# Patient Record
Sex: Male | Born: 1965 | Race: White | Hispanic: No | Marital: Married | Smoking: Never smoker
Health system: Southern US, Community
[De-identification: ages and names within clinical notes are randomized; demographics above are authoritative.]

## PROBLEM LIST (undated history)

## (undated) DIAGNOSIS — K449 Diaphragmatic hernia without obstruction or gangrene: Secondary | ICD-10-CM

## (undated) DIAGNOSIS — Z87442 Personal history of urinary calculi: Secondary | ICD-10-CM

## (undated) DIAGNOSIS — M1990 Unspecified osteoarthritis, unspecified site: Secondary | ICD-10-CM

## (undated) DIAGNOSIS — F32A Depression, unspecified: Secondary | ICD-10-CM

## (undated) DIAGNOSIS — K219 Gastro-esophageal reflux disease without esophagitis: Secondary | ICD-10-CM

## (undated) DIAGNOSIS — N189 Chronic kidney disease, unspecified: Secondary | ICD-10-CM

## (undated) DIAGNOSIS — F329 Major depressive disorder, single episode, unspecified: Secondary | ICD-10-CM

## (undated) DIAGNOSIS — K2270 Barrett's esophagus without dysplasia: Secondary | ICD-10-CM

## (undated) DIAGNOSIS — R110 Nausea: Secondary | ICD-10-CM

## (undated) DIAGNOSIS — F419 Anxiety disorder, unspecified: Secondary | ICD-10-CM

## (undated) HISTORY — PX: VASECTOMY: SHX75

## (undated) HISTORY — PX: FOOT SURGERY: SHX648

## (undated) HISTORY — DX: Diaphragmatic hernia without obstruction or gangrene: K44.9

## (undated) HISTORY — DX: Barrett's esophagus without dysplasia: K22.70

---
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
## 2008-09-13 ENCOUNTER — Ambulatory Visit: Payer: Self-pay | Admitting: General Practice

## 2008-12-24 IMAGING — CR RIGHT TIBIA AND FIBULA - 2 VIEW
1 series · 3 of 3 positions shown · non-contrast
Comparison: none

REASON FOR EXAM: pain redness swelling
COMMENTS:

RESULT:     No fracture, dislocation or other acute bony abnormality is
identified. No lytic or blastic lesions are noted. No radiodense soft tissue
foreign body is seen.

[Series 1: view not recorded · 0.17mm/px · 3 of 3 slices shown]
[im 1/3]
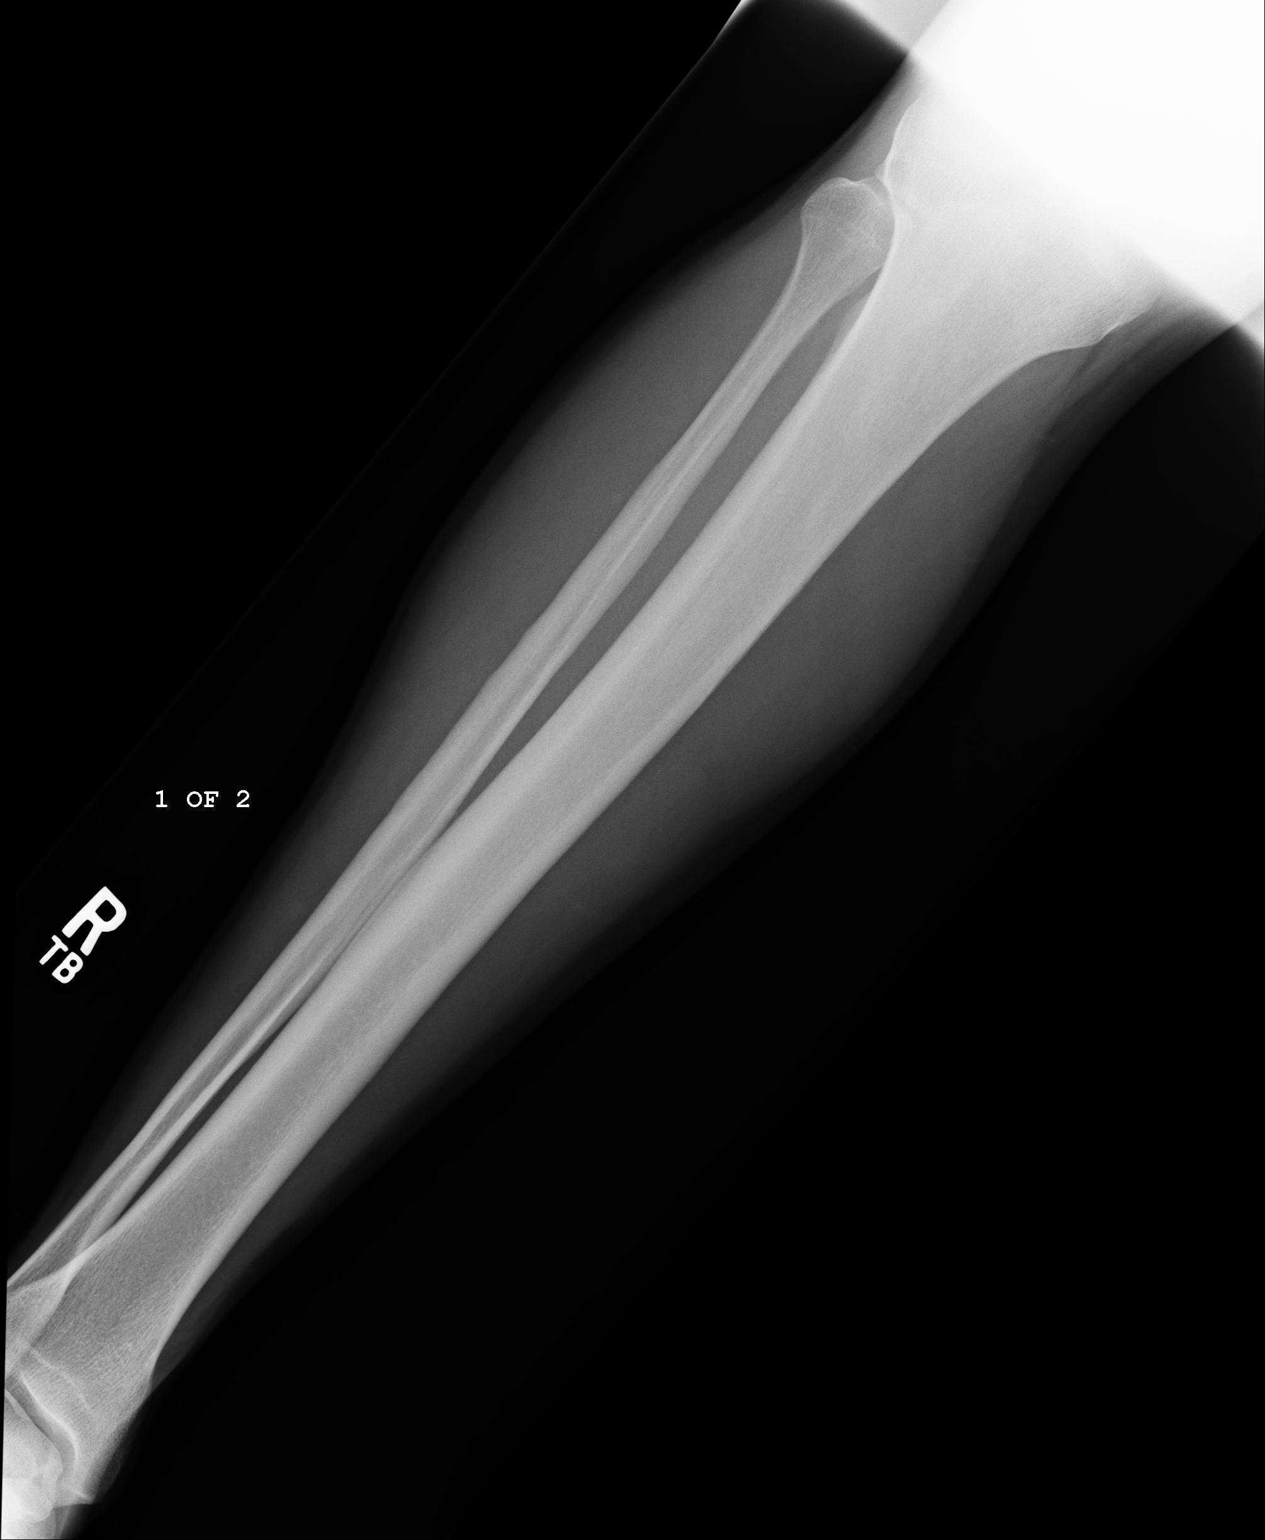
[im 2/3]
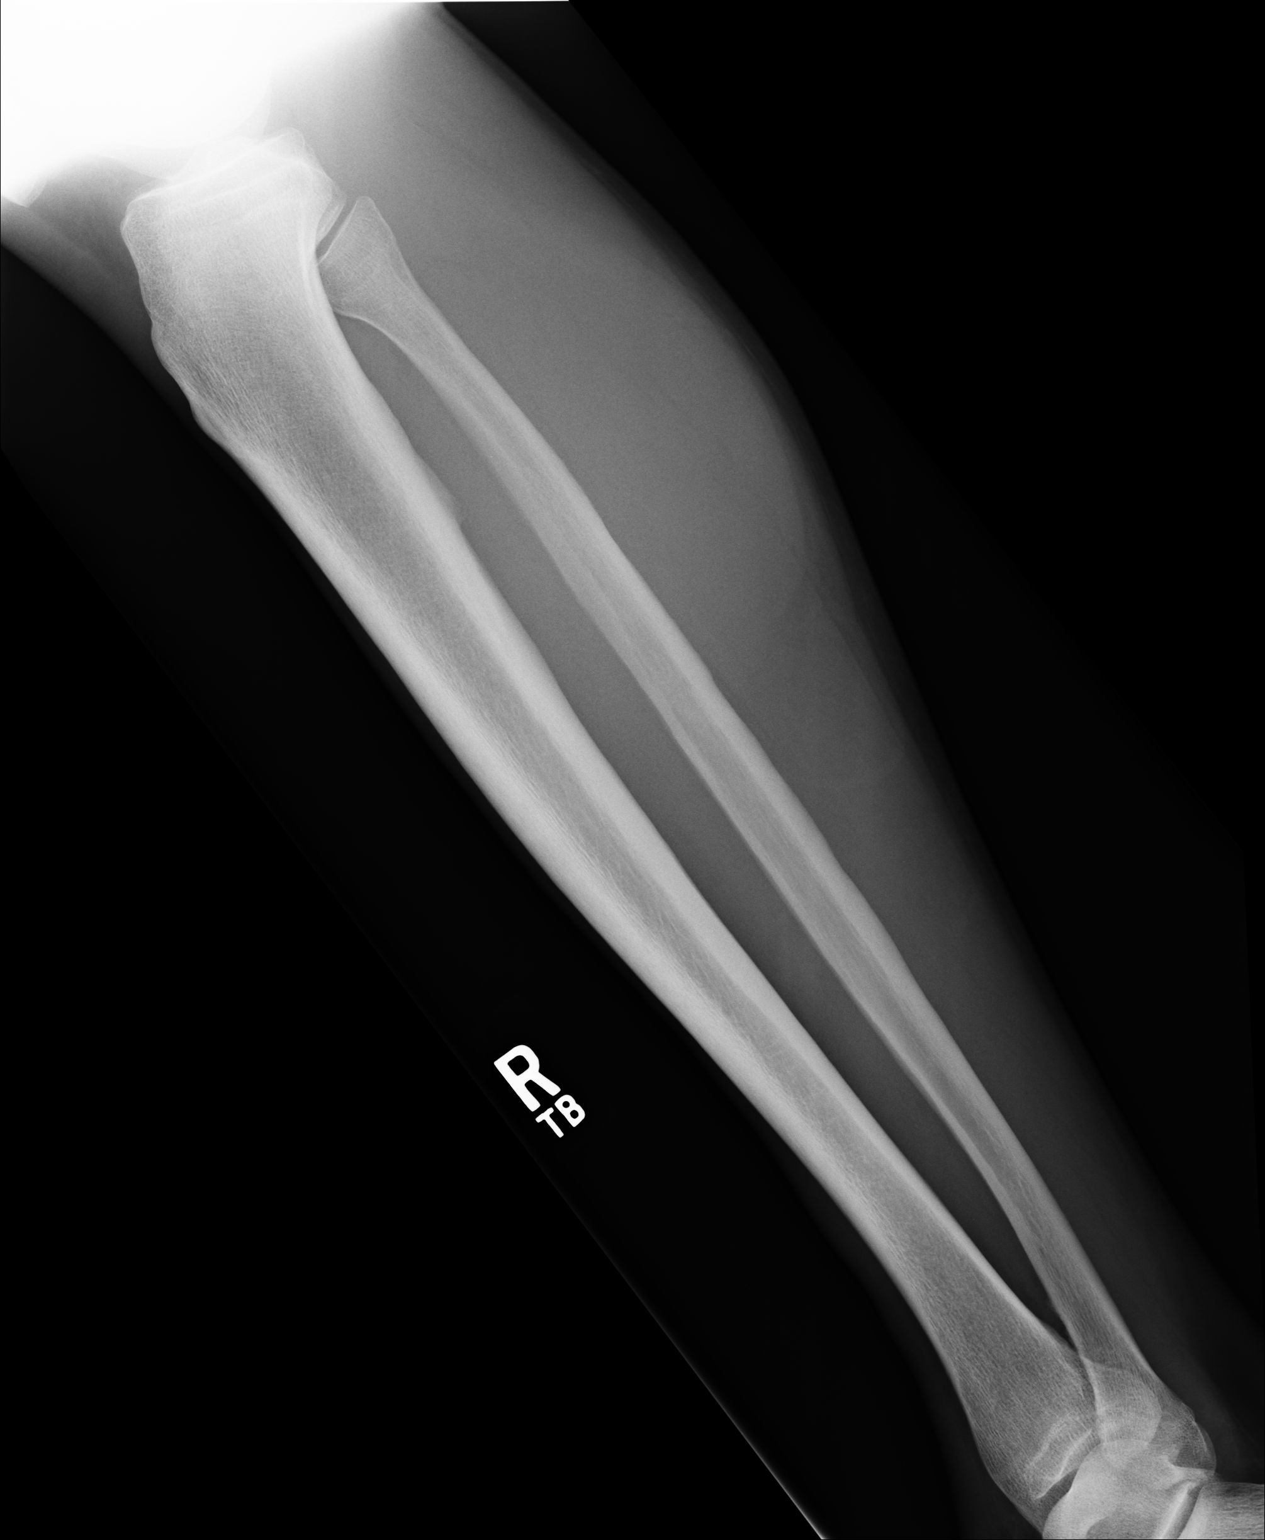
[im 3/3]
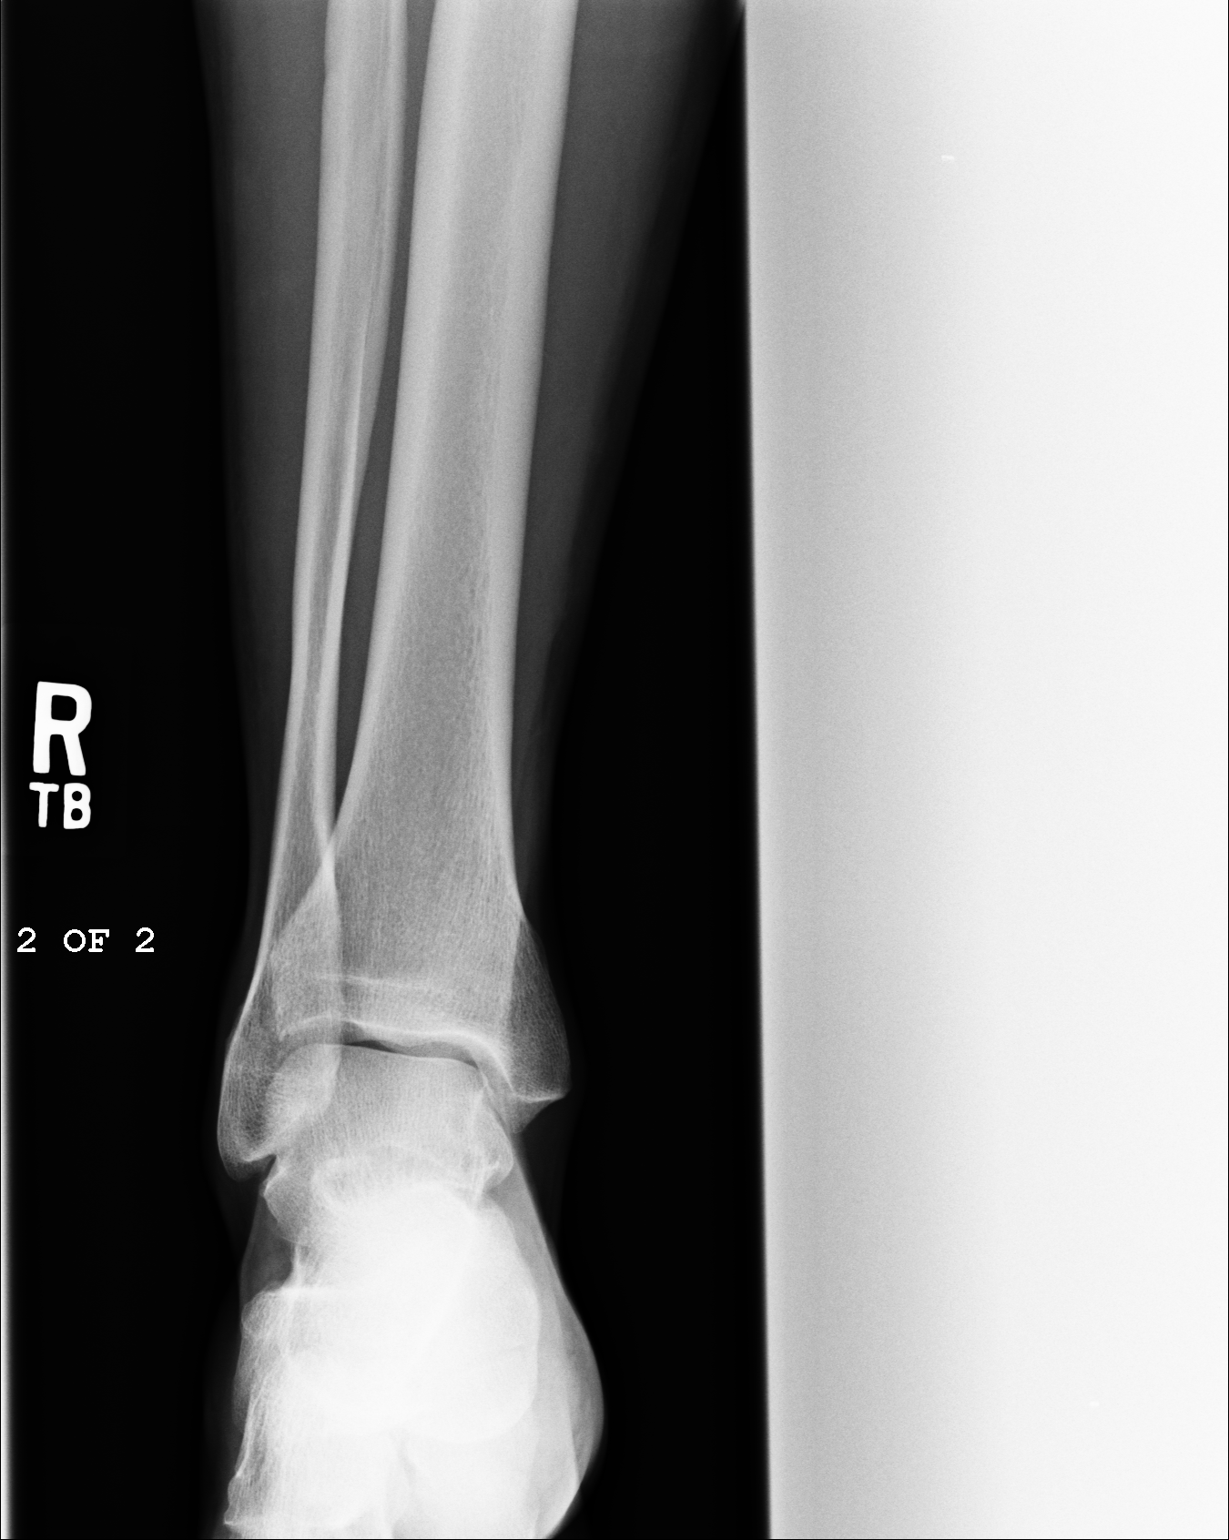

[3 of 3 positions shown; findings below may reference images not displayed]

IMPRESSION: No significant abnormalities are noted.

## 2009-11-17 ENCOUNTER — Emergency Department: Payer: Self-pay | Admitting: Emergency Medicine

## 2009-11-17 IMAGING — CR DG CHEST 1V PORT
1 series · 1 of 1 positions shown · non-contrast
Comparison: none

REASON FOR EXAM: cp
COMMENTS:

PROCEDURE:     DXR - DXR PORTABLE CHEST SINGLE VIEW  - [DATE]  [DATE]
RESULT:     Comparison: None

[view not recorded]
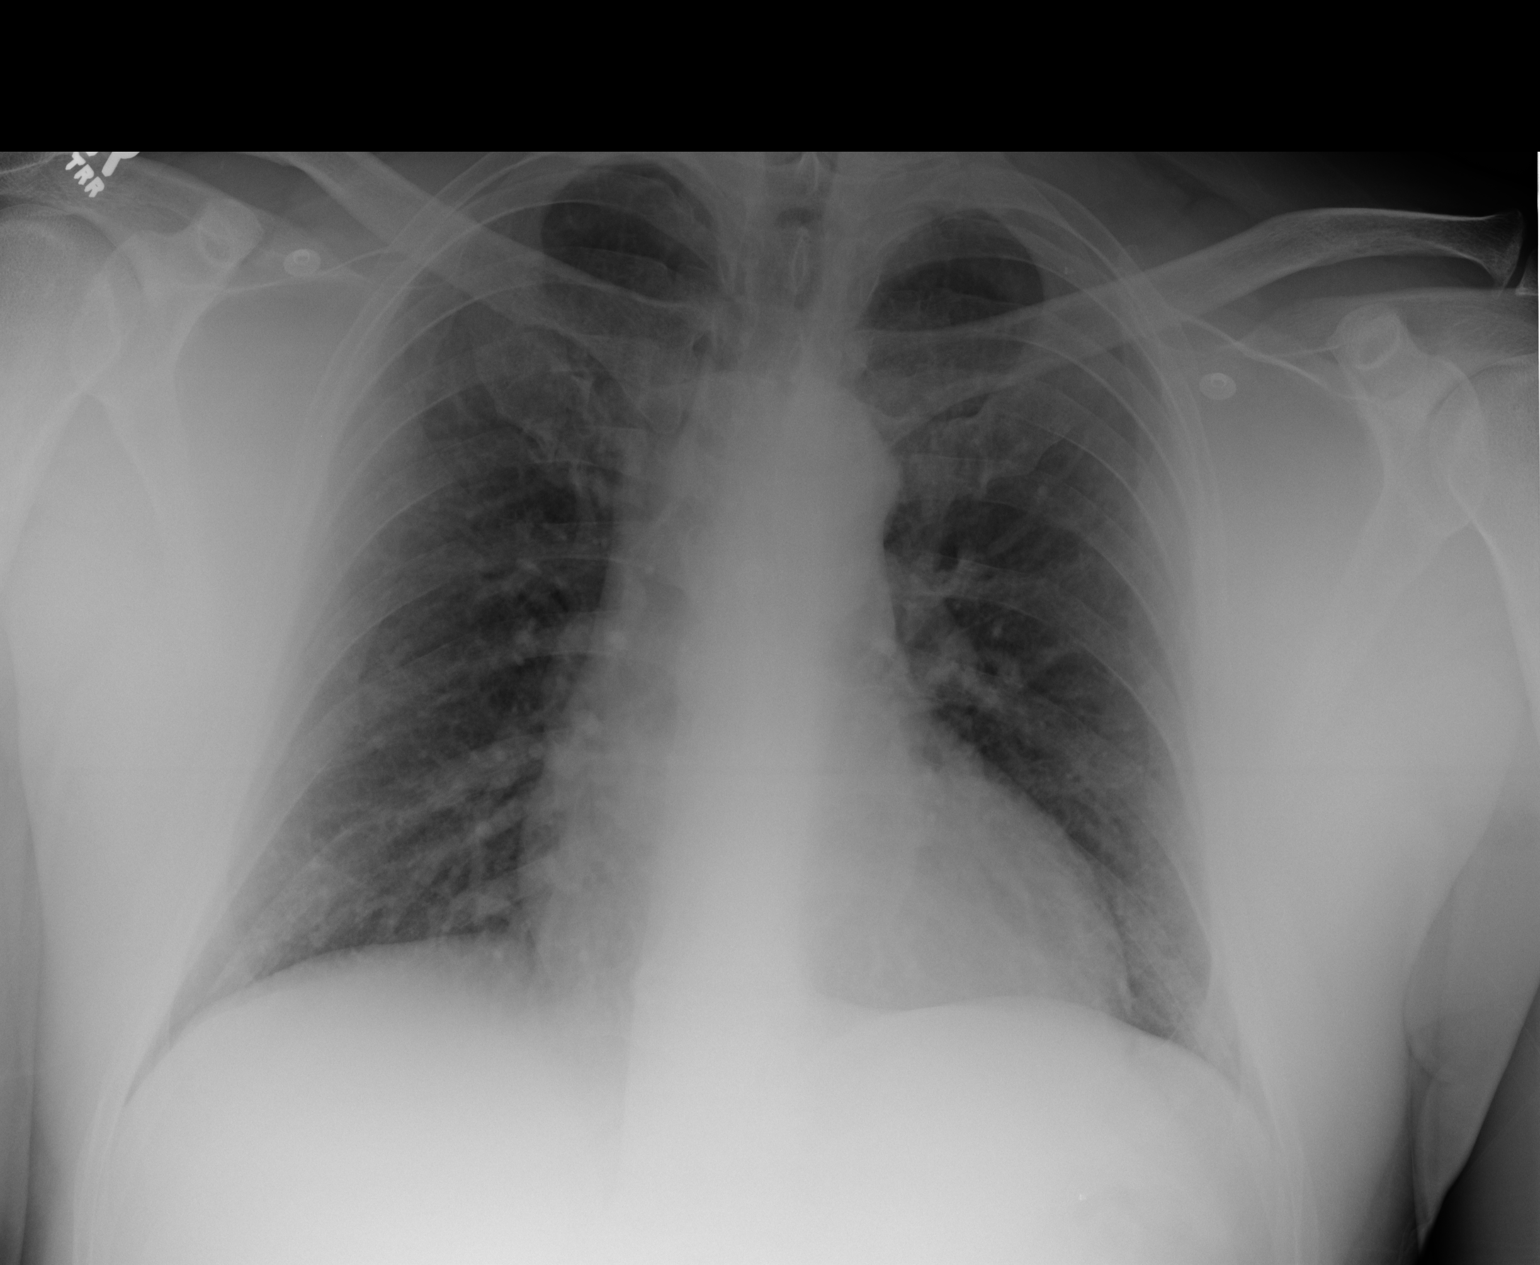

[1 of 1 positions shown; findings below may reference images not displayed]

FINDINGS: Single portable AP chest radiograph is provided. Mild bilateral interstitial
thickening which may reflect interstitial pneumonitis versus mild
interstitial edema versus chronic interstitial disease. There is no focal
parenchymal opacity, pleural effusion, or pneumothorax. Normal
cardiomediastinal silhouette. The osseous structures are unremarkable.
IMPRESSION: Mild bilateral interstitial thickening which may reflect interstitial
pneumonitis versus mild interstitial edema versus chronic interstitial
disease.

## 2012-04-23 HISTORY — PX: FOOT SURGERY: SHX648

## 2012-07-09 ENCOUNTER — Emergency Department: Payer: Self-pay | Admitting: General Practice

## 2012-10-05 ENCOUNTER — Emergency Department: Payer: Self-pay | Admitting: Emergency Medicine

## 2012-10-05 IMAGING — CR DG ANKLE COMPLETE 3+V*L*
1 series · 5 of 5 positions shown · non-contrast
Comparison: none

REASON FOR EXAM: fall
COMMENTS:

PROCEDURE:     DXR - DXR ANKLE LEFT COMPLETE  - [DATE]  [DATE]
RESULT:     Left ankle images show significant soft tissue swelling
laterally in. A definite fractures not appreciated. The internal rotation is
limited. An occult fracture or tiny avulsion distally is not excluded.

[Series 1: x ankle ap left · 0.14mm/px · 5 of 5 slices shown]
[im 1/5]
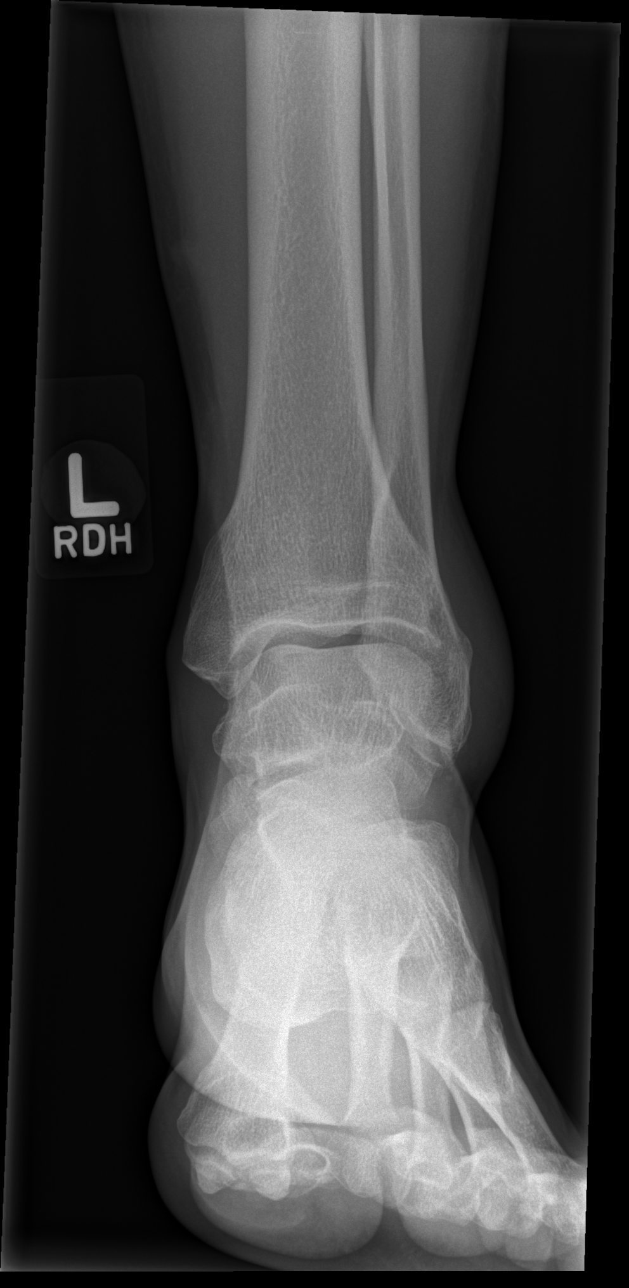
[im 2/5]
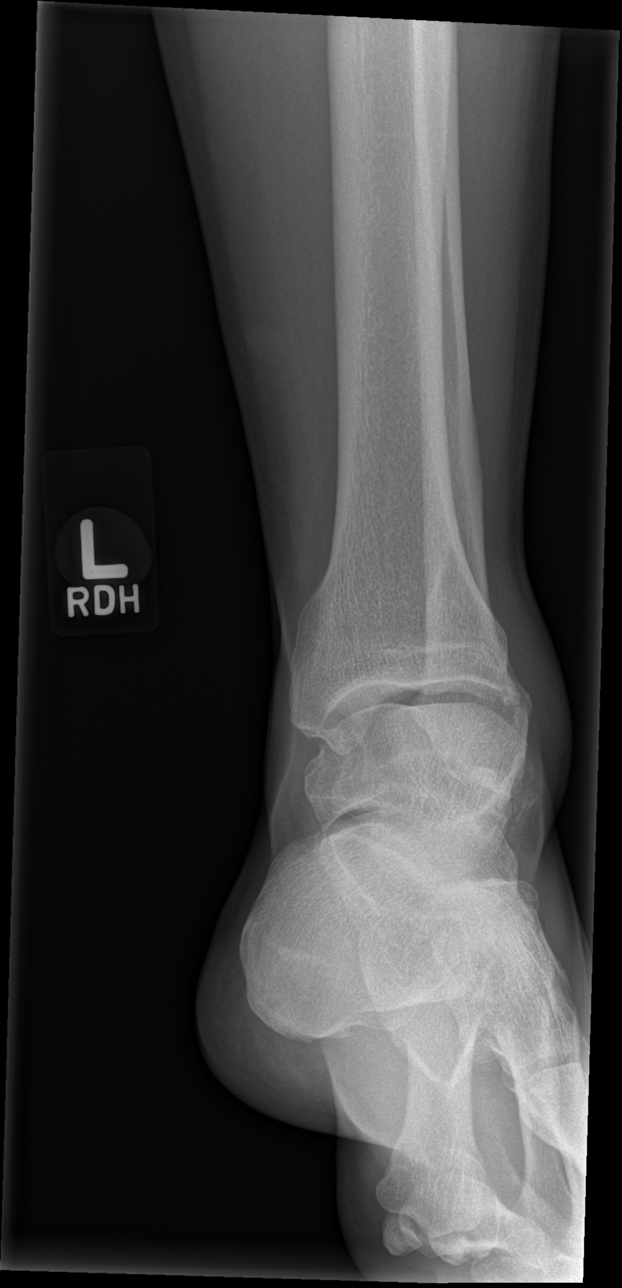
[im 3/5]
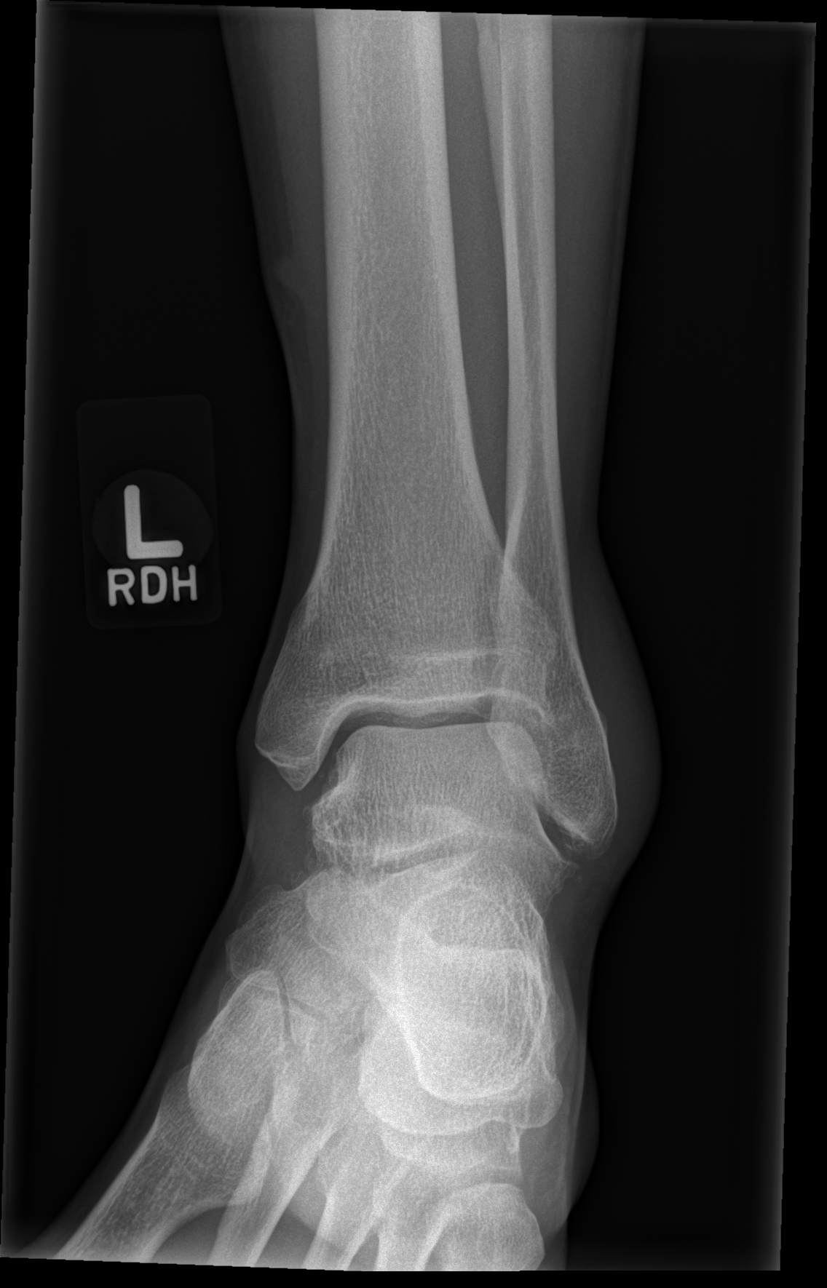
[im 4/5]
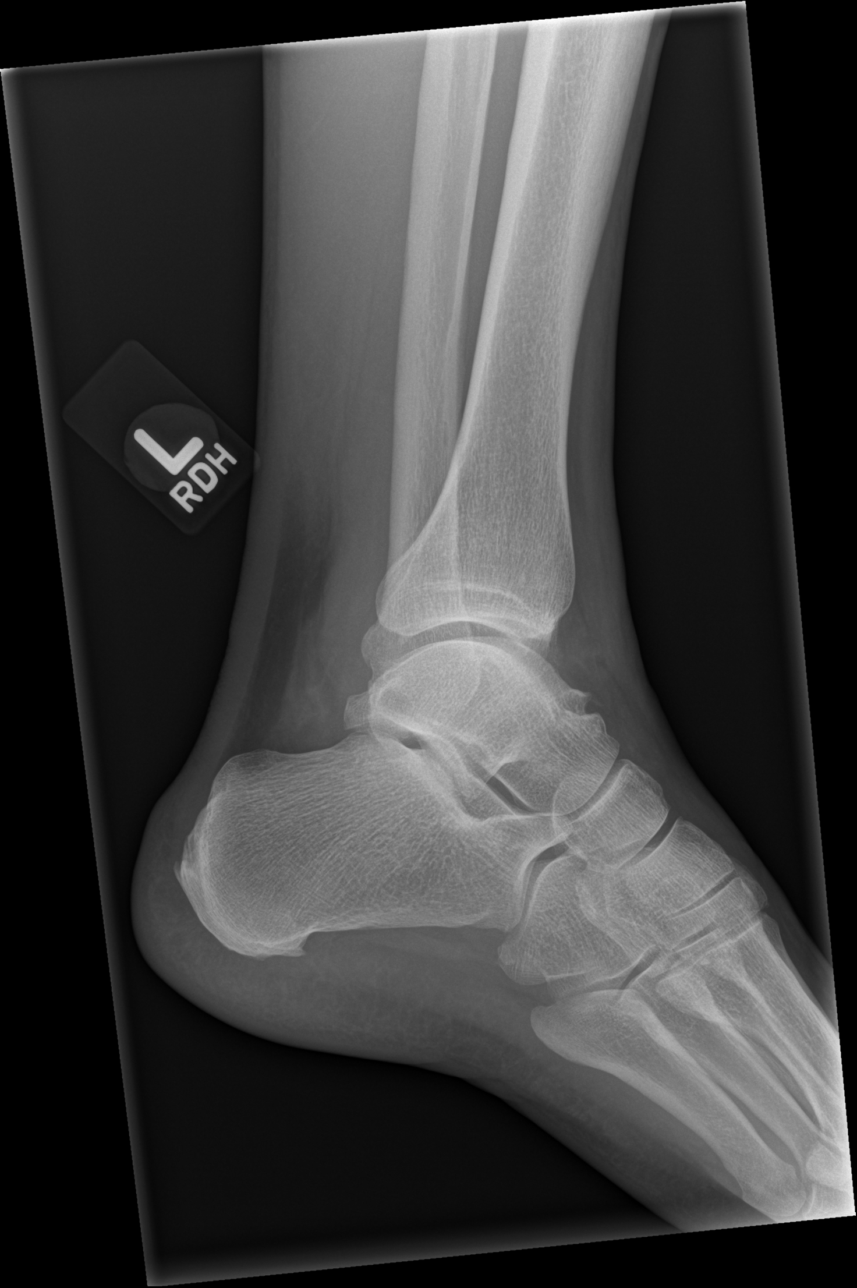
[im 5/5]
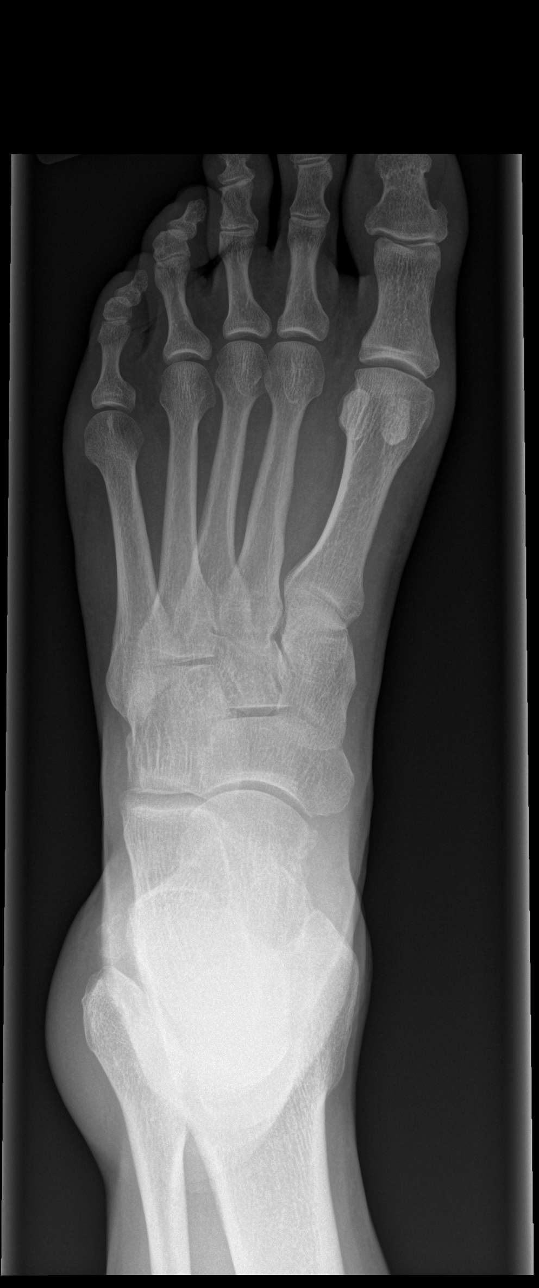

[5 of 5 positions shown; findings below may reference images not displayed]

IMPRESSION: Significant soft tissue swelling. Possible tiny avulsion in
the distal fibula region.

[REDACTED]

## 2013-03-09 ENCOUNTER — Encounter: Payer: Self-pay | Admitting: Podiatry

## 2013-03-13 ENCOUNTER — Encounter: Payer: Self-pay | Admitting: Podiatry

## 2013-03-13 ENCOUNTER — Ambulatory Visit (INDEPENDENT_AMBULATORY_CARE_PROVIDER_SITE_OTHER): Payer: No Typology Code available for payment source | Admitting: Podiatry

## 2013-03-13 ENCOUNTER — Ambulatory Visit (INDEPENDENT_AMBULATORY_CARE_PROVIDER_SITE_OTHER): Payer: No Typology Code available for payment source

## 2013-03-13 VITALS — BP 139/90 | HR 60 | Resp 16 | Ht 73.0 in | Wt 215.0 lb

## 2013-03-13 DIAGNOSIS — M79671 Pain in right foot: Secondary | ICD-10-CM

## 2013-03-13 DIAGNOSIS — M7660 Achilles tendinitis, unspecified leg: Secondary | ICD-10-CM

## 2013-03-13 DIAGNOSIS — M79609 Pain in unspecified limb: Secondary | ICD-10-CM

## 2013-03-13 MED ORDER — TRIAMCINOLONE ACETONIDE 10 MG/ML IJ SUSP
5.0000 mg | Freq: Once | INTRAMUSCULAR | Status: AC
  Administered 2013-03-13: 5 mg via INTRA_ARTICULAR

## 2013-03-13 NOTE — Patient Instructions (Signed)
Plantar Fasciitis (Heel Spur Syndrome) with Rehab The plantar fascia is a fibrous, ligament-like, soft-tissue structure that spans the bottom of the foot. Plantar fasciitis is a condition that causes pain in the foot due to inflammation of the tissue. SYMPTOMS   Pain and tenderness on the underneath side of the foot.  Pain that worsens with standing or walking. CAUSES  Plantar fasciitis is caused by irritation and injury to the plantar fascia on the underneath side of the foot. Common mechanisms of injury include:  Direct trauma to bottom of the foot.  Damage to a small nerve that runs under the foot where the main fascia attaches to the heel bone.  Stress placed on the plantar fascia due to bone spurs. RISK INCREASES WITH:   Activities that place stress on the plantar fascia (running, jumping, pivoting, or cutting).  Poor strength and flexibility.  Improperly fitted shoes.  Tight calf muscles.  Flat feet.  Failure to warm-up properly before activity.  Obesity. PREVENTION  Warm up and stretch properly before activity.  Allow for adequate recovery between workouts.  Maintain physical fitness:  Strength, flexibility, and endurance.  Cardiovascular fitness.  Maintain a health body weight.  Avoid stress on the plantar fascia.  Wear properly fitted shoes, including arch supports for individuals who have flat feet. PROGNOSIS  If treated properly, then the symptoms of plantar fasciitis usually resolve without surgery. However, occasionally surgery is necessary. RELATED COMPLICATIONS   Recurrent symptoms that may result in a chronic condition.  Problems of the lower back that are caused by compensating for the injury, such as limping.  Pain or weakness of the foot during push-off following surgery.  Chronic inflammation, scarring, and partial or complete fascia tear, occurring more often from repeated injections. TREATMENT  Treatment initially involves the use of  ice and medication to help reduce pain and inflammation. The use of strengthening and stretching exercises may help reduce pain with activity, especially stretches of the Achilles tendon. These exercises may be performed at home or with a therapist. Your caregiver may recommend that you use heel cups of arch supports to help reduce stress on the plantar fascia. Occasionally, corticosteroid injections are given to reduce inflammation. If symptoms persist for greater than 6 months despite non-surgical (conservative), then surgery may be recommended.  MEDICATION   If pain medication is necessary, then nonsteroidal anti-inflammatory medications, such as aspirin and ibuprofen, or other minor pain relievers, such as acetaminophen, are often recommended.  Do not take pain medication within 7 days before surgery.  Prescription pain relievers may be given if deemed necessary by your caregiver. Use only as directed and only as much as you need.  Corticosteroid injections may be given by your caregiver. These injections should be reserved for the most serious cases, because they may only be given a certain number of times. HEAT AND COLD  Cold treatment (icing) relieves pain and reduces inflammation. Cold treatment should be applied for 10 to 15 minutes every 2 to 3 hours for inflammation and pain and immediately after any activity that aggravates your symptoms. Use ice packs or massage the area with a piece of ice (ice massage).  Heat treatment may be used prior to performing the stretching and strengthening activities prescribed by your caregiver, physical therapist, or athletic trainer. Use a heat pack or soak the injury in warm water. SEEK IMMEDIATE MEDICAL CARE IF:  Treatment seems to offer no benefit, or the condition worsens.  Any medications produce adverse side effects. EXERCISES RANGE   OF MOTION (ROM) AND STRETCHING EXERCISES - Plantar Fasciitis (Heel Spur Syndrome) These exercises may help you  when beginning to rehabilitate your injury. Your symptoms may resolve with or without further involvement from your physician, physical therapist or athletic trainer. While completing these exercises, remember:   Restoring tissue flexibility helps normal motion to return to the joints. This allows healthier, less painful movement and activity.  An effective stretch should be held for at least 30 seconds.  A stretch should never be painful. You should only feel a gentle lengthening or release in the stretched tissue. RANGE OF MOTION - Toe Extension, Flexion  Sit with your right / left leg crossed over your opposite knee.  Grasp your toes and gently pull them back toward the top of your foot. You should feel a stretch on the bottom of your toes and/or foot.  Hold this stretch for __________ seconds.  Now, gently pull your toes toward the bottom of your foot. You should feel a stretch on the top of your toes and or foot.  Hold this stretch for __________ seconds. Repeat __________ times. Complete this stretch __________ times per day.  RANGE OF MOTION - Ankle Dorsiflexion, Active Assisted  Remove shoes and sit on a chair that is preferably not on a carpeted surface.  Place right / left foot under knee. Extend your opposite leg for support.  Keeping your heel down, slide your right / left foot back toward the chair until you feel a stretch at your ankle or calf. If you do not feel a stretch, slide your bottom forward to the edge of the chair, while still keeping your heel down.  Hold this stretch for __________ seconds. Repeat __________ times. Complete this stretch __________ times per day.  STRETCH  Gastroc, Standing  Place hands on wall.  Extend right / left leg, keeping the front knee somewhat bent.  Slightly point your toes inward on your back foot.  Keeping your right / left heel on the floor and your knee straight, shift your weight toward the wall, not allowing your back to  arch.  You should feel a gentle stretch in the right / left calf. Hold this position for __________ seconds. Repeat __________ times. Complete this stretch __________ times per day. STRETCH  Soleus, Standing  Place hands on wall.  Extend right / left leg, keeping the other knee somewhat bent.  Slightly point your toes inward on your back foot.  Keep your right / left heel on the floor, bend your back knee, and slightly shift your weight over the back leg so that you feel a gentle stretch deep in your back calf.  Hold this position for __________ seconds. Repeat __________ times. Complete this stretch __________ times per day. STRETCH  Gastrocsoleus, Standing  Note: This exercise can place a lot of stress on your foot and ankle. Please complete this exercise only if specifically instructed by your caregiver.   Place the ball of your right / left foot on a step, keeping your other foot firmly on the same step.  Hold on to the wall or a rail for balance.  Slowly lift your other foot, allowing your body weight to press your heel down over the edge of the step.  You should feel a stretch in your right / left calf.  Hold this position for __________ seconds.  Repeat this exercise with a slight bend in your right / left knee. Repeat __________ times. Complete this stretch __________ times per day.    STRENGTHENING EXERCISES - Plantar Fasciitis (Heel Spur Syndrome)  These exercises may help you when beginning to rehabilitate your injury. They may resolve your symptoms with or without further involvement from your physician, physical therapist or athletic trainer. While completing these exercises, remember:   Muscles can gain both the endurance and the strength needed for everyday activities through controlled exercises.  Complete these exercises as instructed by your physician, physical therapist or athletic trainer. Progress the resistance and repetitions only as guided. STRENGTH - Towel  Curls  Sit in a chair positioned on a non-carpeted surface.  Place your foot on a towel, keeping your heel on the floor.  Pull the towel toward your heel by only curling your toes. Keep your heel on the floor.  If instructed by your physician, physical therapist or athletic trainer, add ____________________ at the end of the towel. Repeat __________ times. Complete this exercise __________ times per day. STRENGTH - Ankle Inversion  Secure one end of a rubber exercise band/tubing to a fixed object (table, pole). Loop the other end around your foot just before your toes.  Place your fists between your knees. This will focus your strengthening at your ankle.  Slowly, pull your big toe up and in, making sure the band/tubing is positioned to resist the entire motion.  Hold this position for __________ seconds.  Have your muscles resist the band/tubing as it slowly pulls your foot back to the starting position. Repeat __________ times. Complete this exercises __________ times per day.  Document Released: 04/09/2005 Document Revised: 07/02/2011 Document Reviewed: 07/22/2008 [employer] Patient Information 2014 [employer]. Plantar Fasciitis Plantar fasciitis is a common condition that causes foot pain. It is soreness (inflammation) of the band of tough fibrous tissue on the bottom of the foot that runs from the heel bone (calcaneus) to the ball of the foot. The cause of this soreness may be from excessive standing, poor fitting shoes, running on hard surfaces, being overweight, having an abnormal walk, or overuse (this is common in runners) of the painful foot or feet. It is also common in aerobic exercise dancers and ballet dancers. SYMPTOMS  Most people with plantar fasciitis complain of:  Severe pain in the morning on the bottom of their foot especially when taking the first steps out of bed. This pain recedes after a few minutes of walking.  Severe pain is experienced also during walking  following a long period of inactivity.  Pain is worse when walking barefoot or up stairs DIAGNOSIS   Your caregiver will diagnose this condition by examining and feeling your foot.  Special tests such as X-rays of your foot, are usually not needed. PREVENTION   Consult a sports medicine professional before beginning a new exercise program.  Walking programs offer a good workout. With walking there is a lower chance of overuse injuries common to runners. There is less impact and less jarring of the joints.  Begin all new exercise programs slowly. If problems or pain develop, decrease the amount of time or distance until you are at a comfortable level.  Wear good shoes and replace them regularly.  Stretch your foot and the heel cords at the back of the ankle (Achilles tendon) both before and after exercise.  Run or exercise on even surfaces that are not hard. For example, asphalt is better than pavement.  Do not run barefoot on hard surfaces.  If using a treadmill, vary the incline.  Do not continue to workout if you have foot or joint   problems. Seek professional help if they do not improve. HOME CARE INSTRUCTIONS   Avoid activities that cause you pain until you recover.  Use ice or cold packs on the problem or painful areas after working out.  Only take over-the-counter or prescription medicines for pain, discomfort, or fever as directed by your caregiver.  Soft shoe inserts or athletic shoes with air or gel sole cushions may be helpful.  If problems continue or become more severe, consult a sports medicine caregiver or your own health care provider. Cortisone is a potent anti-inflammatory medication that may be injected into the painful area. You can discuss this treatment with your caregiver. MAKE SURE YOU:   Understand these instructions.  Will watch your condition.  Will get help right away if you are not doing well or get worse. Document Released: 01/02/2001 Document  Revised: 07/02/2011 Document Reviewed: 03/03/2008 [employer] Patient Information 2014 [employer].  

## 2013-03-13 NOTE — Progress Notes (Signed)
Subjective:     Patient ID: Kyle Gallegos, male   DOB: 1965/11/21, 47 y.o.   MRN: [id number]  Foot Pain   patient's found to have a painful right heel lateral side that states has been present for about 6-8 months with relief of about one year from previous treatment. Plantar heel that we did surgery on is doing very well and patient has not had full evaluating in approximately 4 years   Review of Systems  All other systems reviewed and are negative.       Objective:   Physical Exam  Nursing note and vitals reviewed. Constitutional: He is oriented to person, place, and time.  Cardiovascular: Intact distal pulses.   Musculoskeletal: Normal range of motion.  Neurological: He is oriented to person, place, and time.  Skin: Skin is warm.   patient is found to have posterior heel pain right lateral and central band of the Achilles tendon with no equinus condition noted or muscle strength loss and normal neurovascular sensation and circulation. No other pathology noted currently    Assessment:     Tendinitis right Achilles lateral side and slightly into the central tendon    Plan:     H&P and x-rays reviewed with patient care for lateral injection done after explaining to him the chances for rupture associated with the injection. I also discussed possibilities for E. Pat in the future if symptoms continue to persist injection of 3 mg dexamethasone Kenalog combination 5 mg Xylocaine and night splint dispensed for home usage reappoint her recheck when symptomatic

## 2013-09-22 DIAGNOSIS — E559 Vitamin D deficiency, unspecified: Secondary | ICD-10-CM | POA: Insufficient documentation

## 2014-04-14 ENCOUNTER — Ambulatory Visit: Payer: Self-pay | Admitting: Family Medicine

## 2014-04-23 HISTORY — PX: FOOT SURGERY: SHX648

## 2015-03-30 ENCOUNTER — Encounter: Payer: Self-pay | Admitting: Podiatry

## 2015-03-30 ENCOUNTER — Ambulatory Visit (INDEPENDENT_AMBULATORY_CARE_PROVIDER_SITE_OTHER): Payer: No Typology Code available for payment source | Admitting: Podiatry

## 2015-03-30 ENCOUNTER — Ambulatory Visit (INDEPENDENT_AMBULATORY_CARE_PROVIDER_SITE_OTHER): Payer: No Typology Code available for payment source

## 2015-03-30 VITALS — BP 151/84 | HR 52 | Resp 16

## 2015-03-30 DIAGNOSIS — M79671 Pain in right foot: Secondary | ICD-10-CM | POA: Diagnosis not present

## 2015-03-30 NOTE — Progress Notes (Signed)
Subjective:     Patient ID: Kyle LollingJames O Zollars, male   DOB: 01-11-1966, 49 y.o.   MRN: [id number]  HPI patient states I have severe shooting pains between the third and fourth toes of my right foot which is been going on for a while. 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 Patient states she's tried wider shoes and he's tried soaks without relief   Review of Systems  All other systems reviewed and are negative.      Objective:   Physical Exam  Constitutional: He is oriented to person, place, and time.  Cardiovascular: Intact distal pulses.   Musculoskeletal: Normal range of motion.  Neurological: He is oriented to person, place, and time.  Skin: Skin is warm.  Nursing note and vitals reviewed.  neurovascular status found to be intact muscle strength adequate range of motion within normal limits with patient having significant pinching and shooting pains third interspace right foot with palpable mass noted upon pressure. 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 I've recommended neurectomy and I explained surgery to him and reviewed alternative treatments and complications. Patient wants to have this fixed understanding all complications and the fact there is no long-term guarantees and that recovery can take from 6 months to one year for full recovery. At this time patient was given all preoperative instructions was encouraged to call with questions and is scheduled for surgery in the next several weeks

## 2015-03-30 NOTE — Patient Instructions (Signed)
Pre-Operative Instructions  Congratulations, you have decided to take an important step to improving your quality of life.  You can be assured that the doctors of [facility] will be with you every step of the way.  1. Plan to be at the surgery center/hospital at least 1 (one) hour prior to your scheduled time unless otherwise directed by the surgical center/hospital staff.  You must have a responsible adult accompany you, remain during the surgery and drive you home.  Make sure you have directions to the surgical center/hospital and know how to get there on time. 2. For hospital based surgery you will need to obtain a history and physical form from your family physician within 1 month prior to the date of surgery- we will give you a form for you primary physician.  3. We make every effort to accommodate the date you request for surgery.  There are however, times where surgery dates or times have to be moved.  We will contact you as soon as possible if a change in schedule is required.   4. No Aspirin/Ibuprofen for one week before surgery.  If you are on aspirin, any non-steroidal anti-inflammatory medications (Mobic, Aleve, Ibuprofen) you should stop taking it 7 days prior to your surgery.  You make take Tylenol  For pain prior to surgery.  5. Medications- If you are taking daily heart and blood pressure medications, seizure, reflux, allergy, asthma, anxiety, pain or diabetes medications, make sure the surgery center/hospital is aware before the day of surgery so they may notify you which medications to take or avoid the day of surgery. 6. No food or drink after midnight the night before surgery unless directed otherwise by surgical center/hospital staff. 7. No alcoholic beverages 24 hours prior to surgery.  No smoking 24 hours prior to or 24 hours after surgery. 8. Wear loose pants or shorts- loose enough to fit over bandages, boots, and casts. 9. No slip on shoes, sneakers are best. 10. Bring  your boot with you to the surgery center/hospital.  Also bring crutches or a walker if your physician has prescribed it for you.  If you do not have this equipment, it will be provided for you after surgery. 11. If you have not been contracted by the surgery center/hospital by the day before your surgery, call to confirm the date and time of your surgery. 12. Leave-time from work may vary depending on the type of surgery you have.  Appropriate arrangements should be made prior to surgery with your employer. 13. Prescriptions will be provided immediately following surgery by your doctor.  Have these filled as soon as possible after surgery and take the medication as directed. 14. Remove nail polish on the operative foot. 15. Wash the night before surgery.  The night before surgery wash the foot and leg well with the antibacterial soap provided and water paying special attention to beneath the toenails and in between the toes.  Rinse thoroughly with water and dry well with a towel.  Perform this wash unless told not to do so by your physician.  Enclosed: 1 Ice pack (please put in freezer the night before surgery)   1 Hibiclens skin cleaner   Pre-op Instructions  If you have any questions regarding the instructions, do not hesitate to call our office.  [place]: [address] [place], [place] [address] [phone]  [place]: [address] [place], [place] [address] [phone]  [place]: [address]  [place], [place] [address] [phone]  Dr. Richard   Tuchman DPM, Dr. Norman Regal DPM Dr. Richard Sikora DPM, Dr. M. Todd Hyatt DPM, Dr. Kathryn Egerton DPM 

## 2015-04-07 DIAGNOSIS — G5760 Lesion of plantar nerve, unspecified lower limb: Secondary | ICD-10-CM | POA: Diagnosis not present

## 2015-04-12 ENCOUNTER — Ambulatory Visit (INDEPENDENT_AMBULATORY_CARE_PROVIDER_SITE_OTHER): Payer: No Typology Code available for payment source | Admitting: Podiatry

## 2015-04-12 VITALS — Temp 97.5°F

## 2015-04-12 DIAGNOSIS — Z9889 Other specified postprocedural states: Secondary | ICD-10-CM

## 2015-04-12 DIAGNOSIS — M79671 Pain in right foot: Secondary | ICD-10-CM

## 2015-04-12 NOTE — Progress Notes (Signed)
Subjective:     Patient ID: Thomes LollingJames O Vanscyoc, male   DOB: [birth year], 49 y.o.   MRN: [id number]  HPI this patient presents to the office follow-up for a neurectomy of the third interspace of the right foot. He presents to the office stating he is doing well and not much discomfort is noted. He presents to the office wearing the bandage  was applied at the surgical center as well as walking in the surgical shoe.     Review of Systems     Objective:   Physical Exam GENERAL APPEARANCE: Alert, conversant. Appropriately groomed. No acute distress.  VASCULAR: Pedal pulses palpable at  [facility] and [facility] bilateral.  Capillary refill time is immediate to all digits,  Normal temperature gradient.  Digital hair growth is present bilateral  NEUROLOGIC: sensation is normal to 5.07 monofilament at 5/5 sites bilateral.  Light touch is intact bilateral, Muscle strength normal.  MUSCULOSKELETAL: acceptable muscle strength, tone and stability bilateral.  Intrinsic muscluature intact bilateral.  Rectus appearance of foot and digits noted bilateral.   DERMATOLOGIC: good wound coaption and sutures intact.  No signs of redness or swelling noted at surgical site.  Ecchymosis noted 2,3 and 4 toes right foot. No signs of infection or drainage noted.     Assessment:     S/p foot surgery     Plan:     ROV  Betadine/redressing performed.  Home instructions given  RTC 10 days.   Helane GuntherGregory Abdur Hoglund DPM

## 2015-04-21 ENCOUNTER — Encounter: Payer: Self-pay | Admitting: Podiatry

## 2015-04-21 NOTE — Progress Notes (Signed)
DOS 04-07-15  Neurectomy 3rd interspace right   Rx'd Vicodin 10/325 #25

## 2015-05-02 ENCOUNTER — Encounter: Payer: No Typology Code available for payment source | Admitting: Podiatry

## 2015-05-02 ENCOUNTER — Encounter: Payer: Self-pay | Admitting: Emergency Medicine

## 2015-07-22 NOTE — Progress Notes (Signed)
This encounter was created in error - please disregard.

## 2016-04-23 HISTORY — PX: COLONOSCOPY: SHX174

## 2016-04-23 HISTORY — PX: UPPER GI ENDOSCOPY: SHX6162

## 2016-05-28 DIAGNOSIS — F419 Anxiety disorder, unspecified: Secondary | ICD-10-CM | POA: Insufficient documentation

## 2016-05-28 DIAGNOSIS — E7800 Pure hypercholesterolemia, unspecified: Secondary | ICD-10-CM | POA: Insufficient documentation

## 2017-03-21 ENCOUNTER — Other Ambulatory Visit: Payer: Self-pay

## 2017-03-21 DIAGNOSIS — R1011 Right upper quadrant pain: Secondary | ICD-10-CM

## 2017-03-22 ENCOUNTER — Ambulatory Visit
Admission: RE | Admit: 2017-03-22 | Discharge: 2017-03-22 | Disposition: A | Discharge status: Home / Self Care | Payer: PRIVATE HEALTH INSURANCE | Source: Ambulatory Visit | Attending: General Surgery | Admitting: General Surgery

## 2017-03-22 DIAGNOSIS — R1011 Right upper quadrant pain: Secondary | ICD-10-CM

## 2017-03-25 ENCOUNTER — Telehealth: Payer: Self-pay | Admitting: *Deleted

## 2017-03-25 NOTE — Telephone Encounter (Signed)
Notified patient as instructed, patient agrees. [facility] and is being seen today at 3:30pm Dr.Keith Deborah Chalkoldeo

## 2017-03-25 NOTE — Telephone Encounter (Signed)
-----   Message from Kieth BrightlySeeplaputhur G Sankar, MD sent at 03/22/2017  5:20 PM EST ----- Normal study. May benefit with GI consult

## 2017-04-01 ENCOUNTER — Ambulatory Visit: Payer: No Typology Code available for payment source | Admitting: General Surgery

## 2017-07-02 IMAGING — US US ABDOMEN LIMITED
1 series · 14 of 25 positions shown · non-contrast
Comparison: None.

CLINICAL DATA: Right upper quadrant abdominal pain.

EXAM:
ULTRASOUND ABDOMEN LIMITED RIGHT UPPER QUADRANT

[Series 1: us abdomen limited · 0.25mm/px · 14 of 41 slices shown]
[im 1/41]
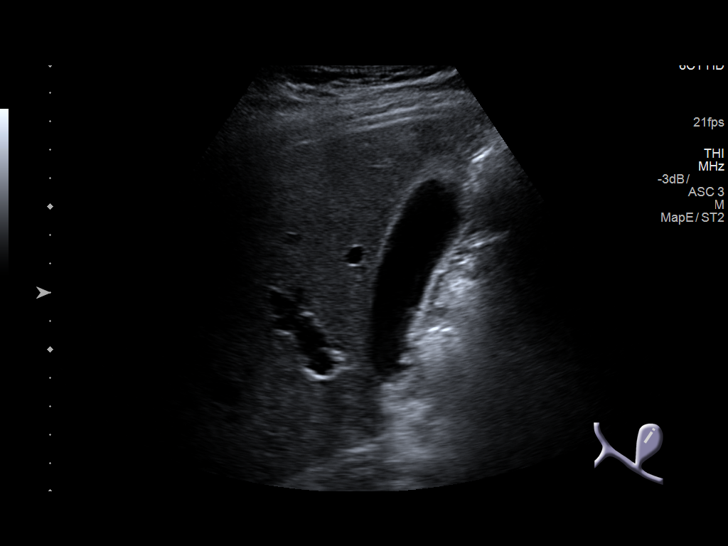
[im 4/41]
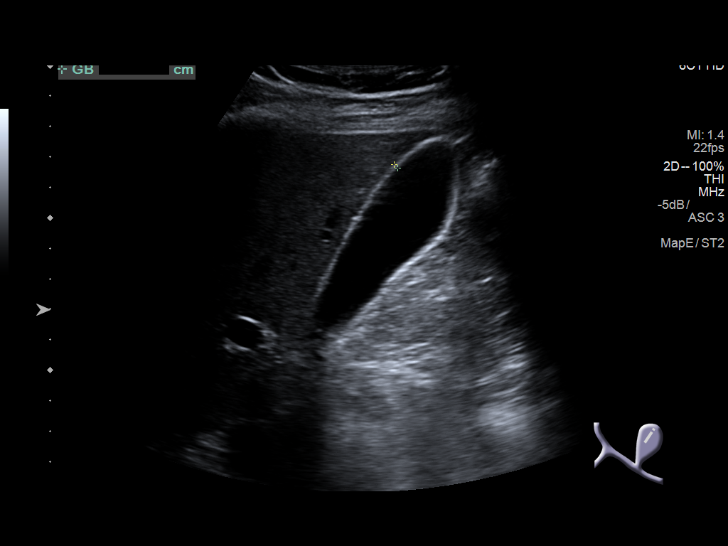
[im 7/41]
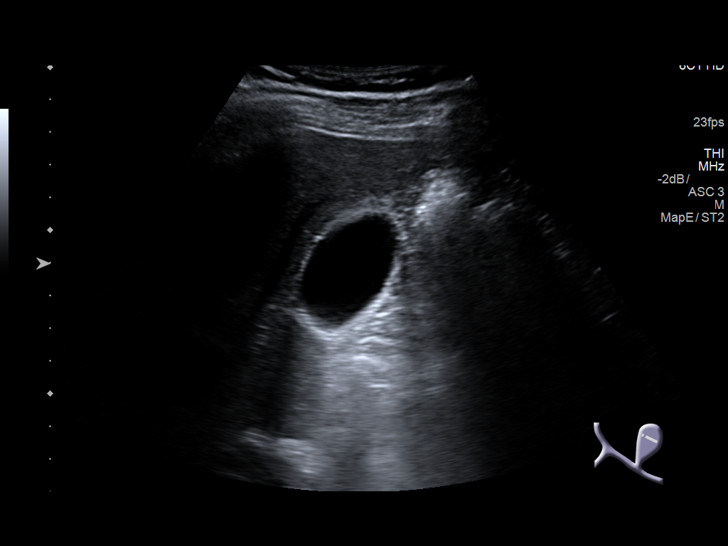
[im 11/41]
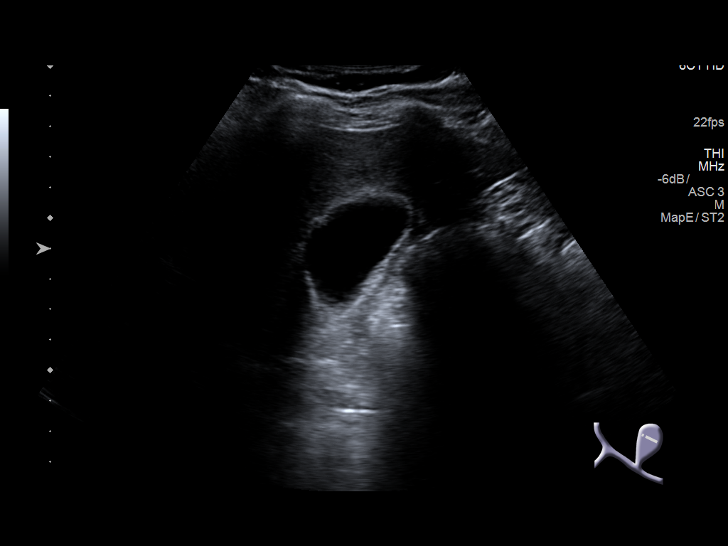
[im 14/41]
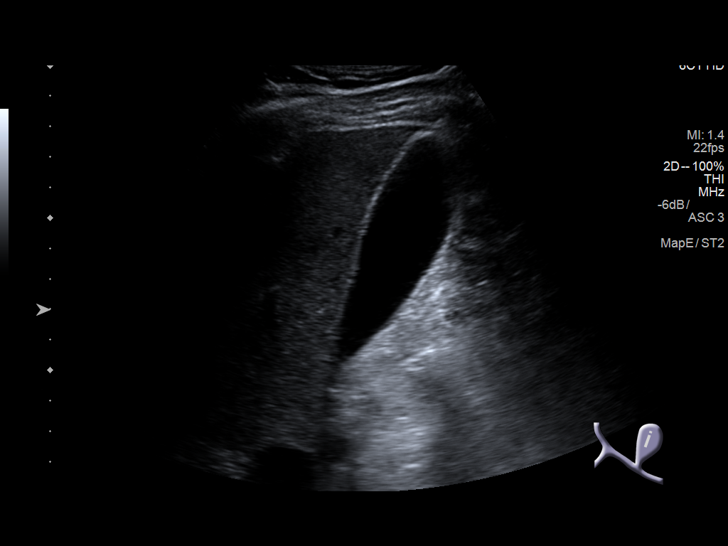
[im 16/41]
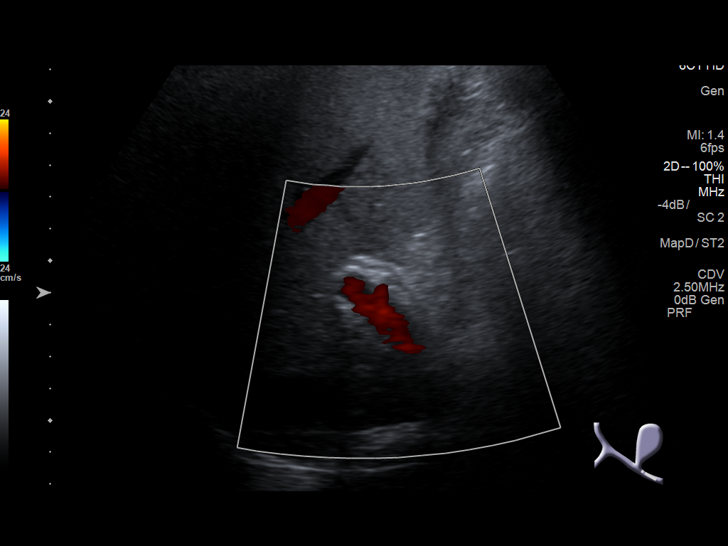
[im 19/41]
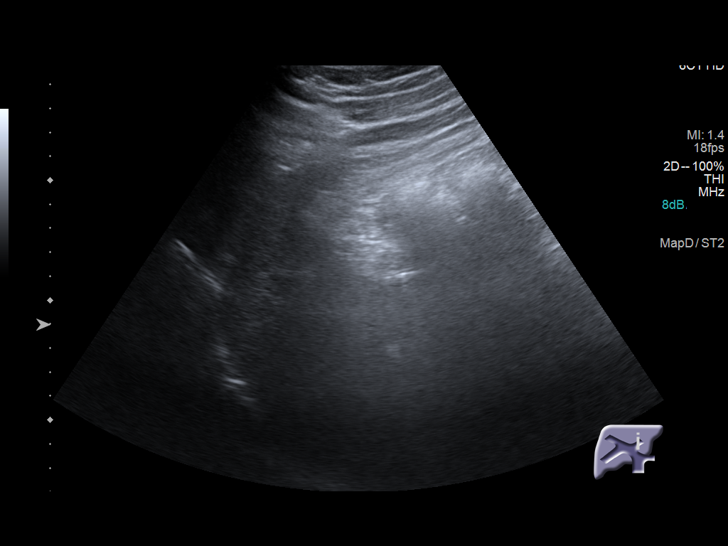
[im 22/41]
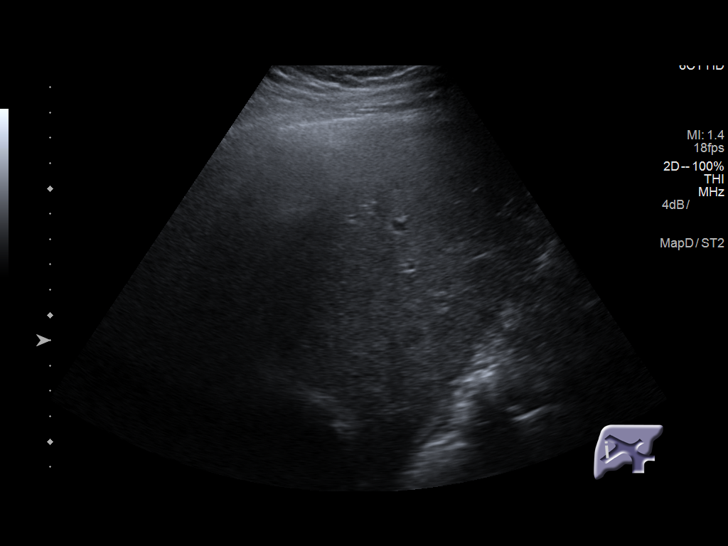
[im 26/41]
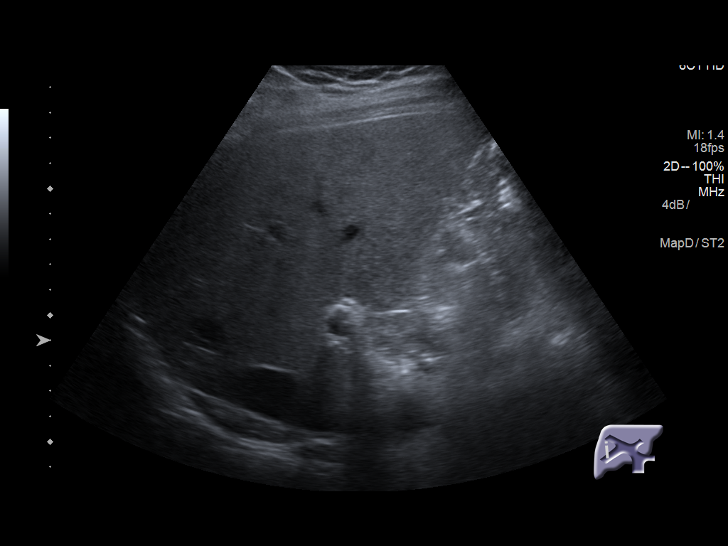
[im 27/41]
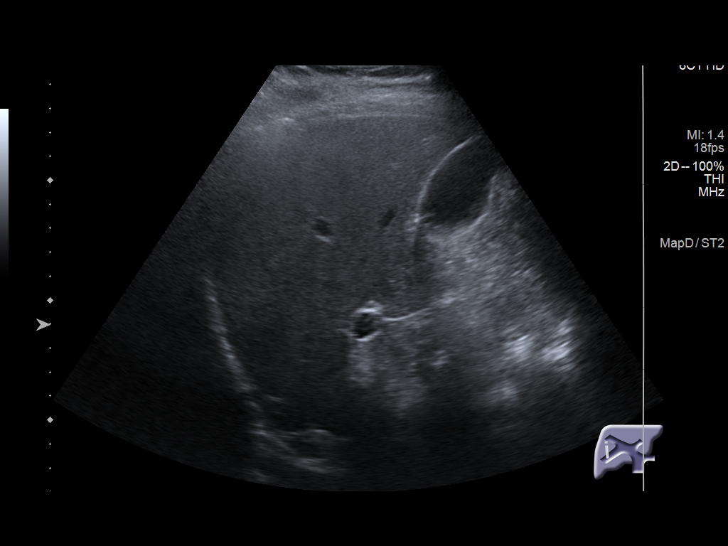
[im 31/41]
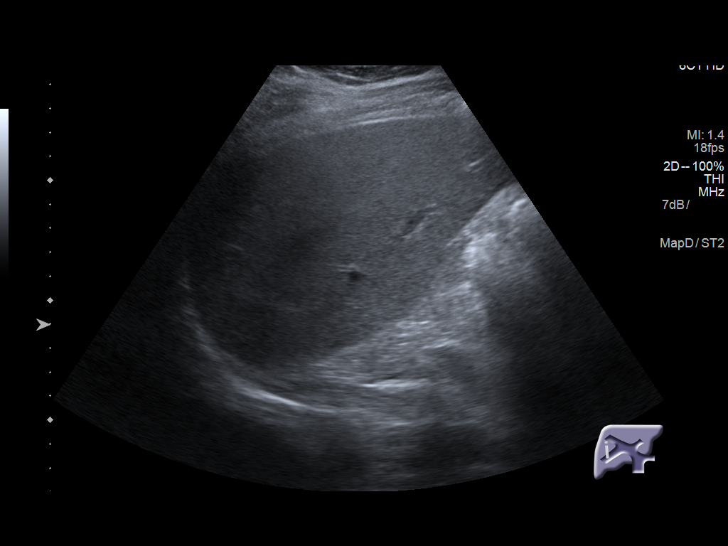
[im 34/41]
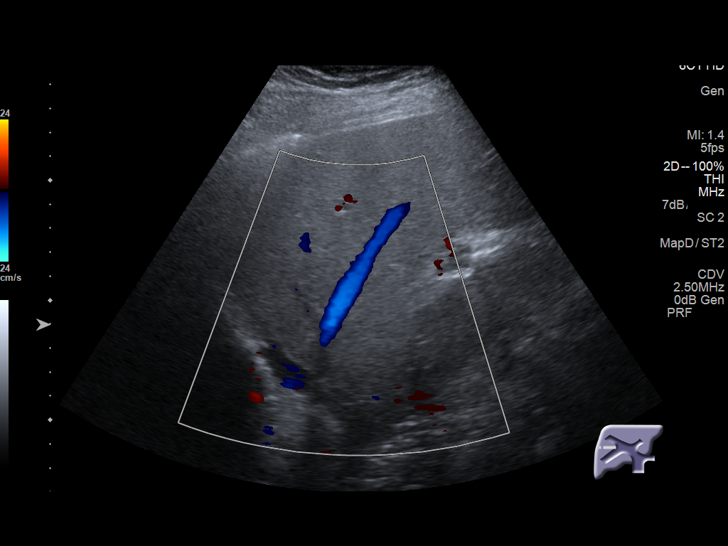
[im 37/41]
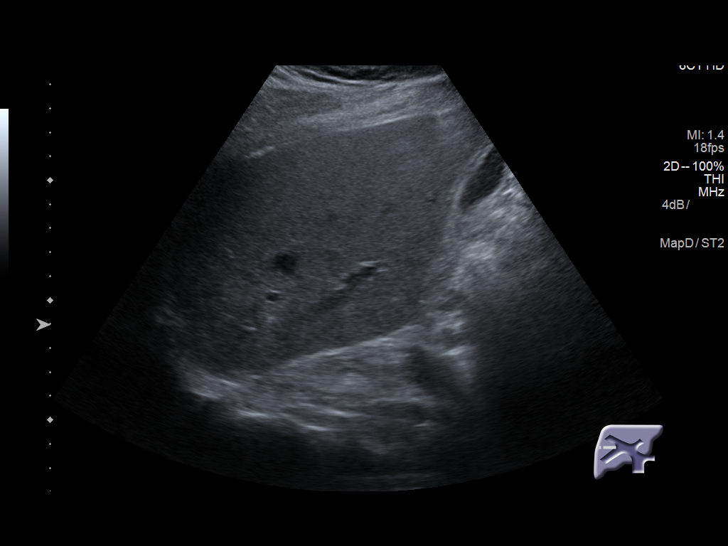
[im 41/41]
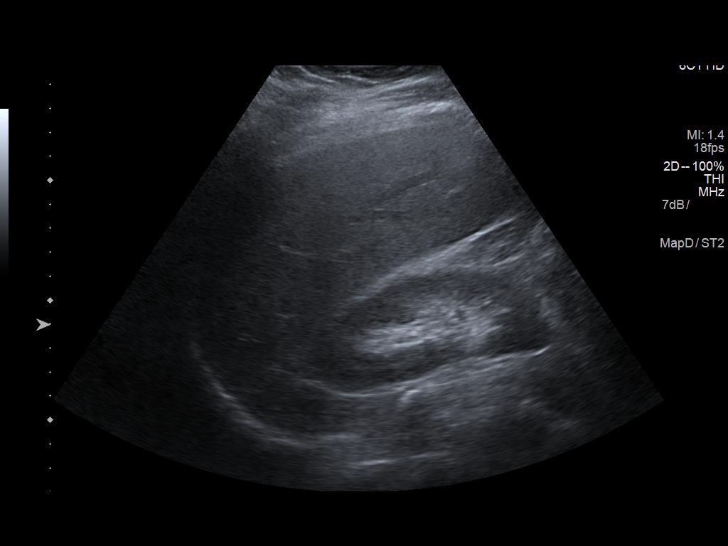

[14 of 25 positions shown; findings below may reference images not displayed]

FINDINGS: Gallbladder:

No gallstones or wall thickening visualized. No sonographic Murphy
sign noted by sonographer.

Common bile duct:

Diameter: 2 mm

Liver:

No focal lesion identified. Within normal limits in parenchymal
echogenicity. Sonographer notes difficulty visualizing some parts of
the left lobe of the liver due to poor sonic windows. Portal vein is
patent on color Doppler imaging with normal direction of blood flow
towards the liver.
IMPRESSION: No significant sonographic abnormality.

## 2018-04-03 ENCOUNTER — Other Ambulatory Visit: Payer: Self-pay

## 2018-04-03 ENCOUNTER — Ambulatory Visit (INDEPENDENT_AMBULATORY_CARE_PROVIDER_SITE_OTHER): Payer: No Typology Code available for payment source | Admitting: General Surgery

## 2018-04-03 ENCOUNTER — Encounter: Payer: Self-pay | Admitting: General Surgery

## 2018-04-03 VITALS — BP 142/88 | HR 73 | Resp 12 | Ht 73.0 in | Wt 224.0 lb

## 2018-04-03 DIAGNOSIS — K219 Gastro-esophageal reflux disease without esophagitis: Secondary | ICD-10-CM | POA: Diagnosis not present

## 2018-04-03 NOTE — Patient Instructions (Signed)
The patient is aware to call back for any questions or new concerns.  

## 2018-04-03 NOTE — Progress Notes (Signed)
Patient ID: Kyle Gallegos, male   DOB: Nov 22, 1965, 52 y.o.   MRN: [id number]  Chief Complaint  Patient presents with  . Abdominal Pain    HPI Kyle LollingJames O Dry is a 52 y.o. male.   Here for evaluation of right abdominal pain. He admits to having epigastric pain described as a "pressure", "discomfort" as well as right upper abdominal pain that comes and goes. He is even having trouble sitting for long periods of time. This episode has lasted for about 2 months. All foods seem to be causing trouble, worse after meals. He states it started about a year ago but seems to have gotten worse. He had endoscopy and colonoscopy last year. He states he does cough with meals. He denies trouble swallowing meats and breads. He does admit to vomiting if he eats, then lays down, and has a bitter taste. He states ice cream and milk will make him vomit.  This is not associated with abdominal bloating, cramping or diarrhea which would be more typical of lactose intolerance.  He can tolerate almond milk. He was diagnosed with Barrett's epithelial changes at the time of his 2018 endoscopy.  He is making use of a PPI, in spite of this has daily epigastric discomfort. Bowels move regular and with each meal. He [facility] 03[facility]22/17 and says he possibly needs a HIDA scan.  HPI  Past Medical History:  Diagnosis Date  . Barrett's esophagus   . Hiatal hernia     Past Surgical History:  Procedure Laterality Date  . COLONOSCOPY  2018   Dr Norma Fredricksonoledo  . FOOT SURGERY Left   . UPPER GI ENDOSCOPY  2018   Dr Norma Fredricksonoledo  . VASECTOMY      Family History  Problem Relation Age of Onset  . Breast cancer Mother   . Bladder Cancer Mother   . Heart attack Mother   . Stroke Father     Social History Social History   Tobacco Use  . Smoking status: Never Smoker  . Smokeless tobacco: Never Used  Substance Use Topics  . Alcohol use: Yes  . Drug use: No    No Known Allergies  Current Outpatient Medications   Medication Sig Dispense Refill  . atorvastatin (LIPITOR) 10 MG tablet TAKE 1 TABLET BY MOUTH ONCE DAILY    . buPROPion (WELLBUTRIN XL) 150 MG 24 hr tablet Take 150 mg by mouth daily.     Marland Kitchen. escitalopram (LEXAPRO) 10 MG tablet Take 10 mg by mouth daily.     . pantoprazole (PROTONIX) 40 MG tablet Take 40 mg by mouth daily.      No current facility-administered medications for this visit.     Review of Systems Review of Systems  Constitutional: Negative.   Respiratory: Negative.   Cardiovascular: Negative.   Gastrointestinal: Positive for abdominal pain, nausea and vomiting. Negative for constipation and diarrhea.    Blood pressure (!) 142/88, pulse 73, resp. rate 12, height 6\' 1"  (1.854 m), weight 224 lb (101.6 kg), SpO2 97 %.  Physical Exam Physical Exam Constitutional:      Appearance: He is well-developed.  HENT:     Mouth/Throat:     Pharynx: No oropharyngeal exudate.  Eyes:     General: No scleral icterus.    Conjunctiva/sclera: Conjunctivae normal.  Neck:     Musculoskeletal: Neck supple.  Cardiovascular:     Rate and Rhythm: Normal rate and regular rhythm.     Heart sounds: Normal heart sounds.  Pulmonary:     Effort: Pulmonary effort is normal.     Breath sounds: Normal breath sounds.  Abdominal:     General: Bowel sounds are normal.     Palpations: Abdomen is soft.     Tenderness: There is no abdominal tenderness.  Skin:    General: Skin is warm and dry.  Neurological:     Mental Status: He is alert and oriented to person, place, and time.  Psychiatric:        Behavior: Behavior normal.     Data Reviewed Abdominal ultrasound dated March 22, 2017 showed no sonographic abnormality.  GI evaluation of March 25, 2017 with Bing Plume, MD reviewed.  Assessment    Retrosternal and epigastric discomfort suggestive of esophageal dysmotility/spasm rather than biliary colic.    Plan    Symptoms do not warrant HIDA scanning as there is no dietary  intolerance, and the patient has been gaining weight.  Little suspicion for malignancy with weight gain and long duration of symptoms.  Retrosternal symptoms predominate, and the potential for esophageal dysmotility or spasm are high.  I discussed the case with Dr. Norma Fredrickson and his office will be contacting the patient to arrange for follow-up.      HPI, Physical Exam, Assessment and Plan have been scribed under the direction and in the presence of Earline Mayotte, MD. Dorathy Daft, RN  I have completed the exam and reviewed the above documentation for accuracy and completeness.  I agree with the above.  Museum/gallery conservator has been used and any errors in dictation or transcription are unintentional.  Donnalee Curry, M.D., F.A.C.S.  Merrily Pew Chennel Olivos 04/05/2018, 10:24 AM

## 2018-04-05 DIAGNOSIS — K219 Gastro-esophageal reflux disease without esophagitis: Secondary | ICD-10-CM | POA: Insufficient documentation

## 2018-04-10 ENCOUNTER — Other Ambulatory Visit: Payer: Self-pay | Admitting: Internal Medicine

## 2018-04-10 DIAGNOSIS — R1013 Epigastric pain: Secondary | ICD-10-CM | POA: Insufficient documentation

## 2018-04-10 DIAGNOSIS — K2270 Barrett's esophagus without dysplasia: Secondary | ICD-10-CM | POA: Insufficient documentation

## 2018-04-10 DIAGNOSIS — K30 Functional dyspepsia: Secondary | ICD-10-CM | POA: Insufficient documentation

## 2018-04-10 DIAGNOSIS — R112 Nausea with vomiting, unspecified: Secondary | ICD-10-CM

## 2018-04-10 DIAGNOSIS — K219 Gastro-esophageal reflux disease without esophagitis: Secondary | ICD-10-CM

## 2018-04-24 ENCOUNTER — Ambulatory Visit
Admission: RE | Admit: 2018-04-24 | Discharge: 2018-04-24 | Disposition: A | Discharge status: Home / Self Care | Payer: No Typology Code available for payment source | Source: Ambulatory Visit | Attending: Internal Medicine | Admitting: Internal Medicine

## 2018-04-24 DIAGNOSIS — R112 Nausea with vomiting, unspecified: Secondary | ICD-10-CM | POA: Diagnosis present

## 2018-04-24 DIAGNOSIS — R110 Nausea: Secondary | ICD-10-CM

## 2018-04-24 DIAGNOSIS — K219 Gastro-esophageal reflux disease without esophagitis: Secondary | ICD-10-CM | POA: Diagnosis present

## 2018-04-24 HISTORY — DX: Nausea: R11.0

## 2018-04-24 IMAGING — NM NM HEPATO W/GB/PHARM/[PERSON_NAME]
3 series · 18 of 18 positions shown · non-contrast
Comparison: None.

CLINICAL DATA: Nausea and vomiting

EXAM:
NUCLEAR MEDICINE HEPATOBILIARY IMAGING WITH GALLBLADDER EF
VIEWS:
Anterior right upper quadrant
RADIOPHARMACEUTICALS:  5.15 mCi [ZQ]  Choletec IV

[Series 1000: hepato ef acq · 4.80mm/px · 6 of 120 frames shown]
[frame 11/120]
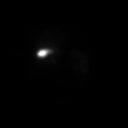
[frame 31/120]
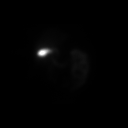
[frame 51/120]
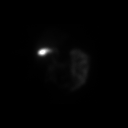
[frame 71/120]
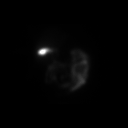
[frame 91/120]
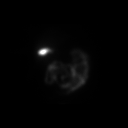
[frame 111/120]
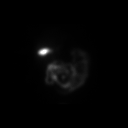

[Series 1000: hepato ef acq (results) · 4.80mm/px · 6 of 120 frames shown]
[frame 11/120]
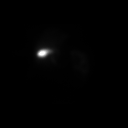
[frame 31/120]
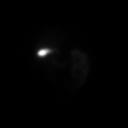
[frame 51/120]
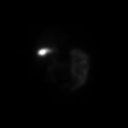
[frame 71/120]
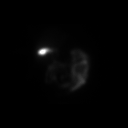
[frame 91/120]
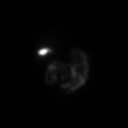
[frame 111/120]
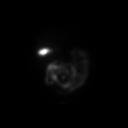

[Series 1000: hepatobiliary scan · 9.59mm/px · 6 of 60 frames shown]
[frame 6/60]
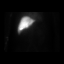
[frame 16/60]
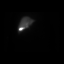
[frame 26/60]
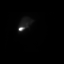
[frame 36/60]
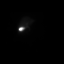
[frame 46/60]
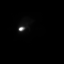
[frame 56/60]
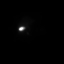

[18 of 18 positions shown; findings below may reference images not displayed]

FINDINGS: Liver uptake of radiotracer is unremarkable. There is prompt
visualization of gallbladder and small bowel, indicating patency of
the cystic and common bile ducts. Patient consumed 8 ounces of
Ensure orally with calculation of the computer generated ejection
fraction of radiotracer from the gallbladder. The patient
experienced abdominal pain with the oral Ensure consumption. The
computer generated ejection fraction of radiotracer from the
gallbladder is normal at 48%, normal greater than 33% using oral
agent.
IMPRESSION: Normal ejection fraction of radiotracer from the gallbladder. The
patient did experience clinical symptoms with the oral Ensure
consumption. Cystic and common bile ducts are patent as is evidenced
by visualization of gallbladder and small bowel.

## 2018-04-24 MED ORDER — TECHNETIUM TC 99M MEBROFENIN IV KIT
5.0000 | PACK | Freq: Once | INTRAVENOUS | Status: AC | PRN
  Administered 2018-04-24: 5.15 via INTRAVENOUS

## 2018-07-17 ENCOUNTER — Ambulatory Visit: Payer: No Typology Code available for payment source | Admitting: Podiatry

## 2018-08-13 ENCOUNTER — Ambulatory Visit (INDEPENDENT_AMBULATORY_CARE_PROVIDER_SITE_OTHER): Payer: No Typology Code available for payment source

## 2018-08-13 ENCOUNTER — Other Ambulatory Visit: Payer: Self-pay

## 2018-08-13 ENCOUNTER — Encounter: Payer: Self-pay | Admitting: Podiatry

## 2018-08-13 ENCOUNTER — Other Ambulatory Visit: Payer: Self-pay | Admitting: Podiatry

## 2018-08-13 ENCOUNTER — Ambulatory Visit (INDEPENDENT_AMBULATORY_CARE_PROVIDER_SITE_OTHER): Payer: No Typology Code available for payment source | Admitting: Podiatry

## 2018-08-13 VITALS — Temp 97.9°F

## 2018-08-13 DIAGNOSIS — M79672 Pain in left foot: Secondary | ICD-10-CM | POA: Diagnosis not present

## 2018-08-13 DIAGNOSIS — M722 Plantar fascial fibromatosis: Secondary | ICD-10-CM

## 2018-08-13 MED ORDER — TRIAMCINOLONE ACETONIDE 10 MG/ML IJ SUSP
10.0000 mg | Freq: Once | INTRAMUSCULAR | Status: AC
  Administered 2018-08-13: 10 mg

## 2018-08-13 MED ORDER — DICLOFENAC SODIUM 75 MG PO TBEC: 75.0000 mg | DELAYED_RELEASE_TABLET | Freq: Two times a day (BID) | ORAL | 2 refills | Status: DC

## 2018-08-13 NOTE — Patient Instructions (Signed)

## 2018-08-13 NOTE — Progress Notes (Signed)
Subjective:   Patient ID: Kyle Gallegos, male   DOB: 53 y.o.   MRN: [id number]   HPI Patient presents with a lot of pain in the left plantar heel that is been present for approximately 3 months.  Patient had previous injection which was not successful by family physician and presents stating the pain is quite intense and also has had previous neuroma surgery right but I have done a number of years ago and is done well   Review of Systems  All other systems reviewed and are negative.       Objective:  Physical Exam Vitals signs and nursing note reviewed.  Constitutional:      Appearance: He is well-developed.  Pulmonary:     Effort: Pulmonary effort is normal.  Musculoskeletal: Normal range of motion.  Skin:    General: Skin is warm.  Neurological:     Mental Status: He is alert.     Neurovascular status intact muscle strength was found to be adequate range of motion within normal limits with well-healed scar right foot.  Patient has exquisite discomfort plantar aspect left heel at the insertion of tendon into the calcaneus with inflammation fluid of the medial band and tenderness with palpation     Assessment:  Acute plantar fasciitis left with inflammation fluid buildup     Plan:  H&P both conditions discussed and very satisfied with the right foot.  Today I went ahead for the left and I did sterile prep and injected the plantar fashion 3 mg Kenalog 5 mg Xylocaine applied fascial brace placed on oral anti-inflammatory and gave instructions for physical therapy shoe gear modifications.  Reappoint to recheck 2 weeks  X-rays indicate plantar spur of the left heel with moderate high arch foot structure

## 2018-08-27 ENCOUNTER — Ambulatory Visit: Payer: No Typology Code available for payment source | Admitting: Podiatry

## 2018-10-09 ENCOUNTER — Ambulatory Visit (INDEPENDENT_AMBULATORY_CARE_PROVIDER_SITE_OTHER): Payer: No Typology Code available for payment source

## 2018-10-09 ENCOUNTER — Ambulatory Visit (INDEPENDENT_AMBULATORY_CARE_PROVIDER_SITE_OTHER): Payer: No Typology Code available for payment source | Admitting: Podiatry

## 2018-10-09 ENCOUNTER — Other Ambulatory Visit: Payer: Self-pay

## 2018-10-09 ENCOUNTER — Encounter: Payer: Self-pay | Admitting: Podiatry

## 2018-10-09 DIAGNOSIS — M778 Other enthesopathies, not elsewhere classified: Secondary | ICD-10-CM

## 2018-10-09 DIAGNOSIS — M779 Enthesopathy, unspecified: Secondary | ICD-10-CM

## 2018-10-16 NOTE — Progress Notes (Signed)
Subjective:   Patient ID: Kyle Gallegos, male   DOB: 53 y.o.   MRN: [id number]   HPI Patient presents stating that he stepped wrong on his heel is concerned that he may have done some damage to it   ROS      Objective:  Physical Exam  Neurovascular status intact with patient's left plantar heel showing irritation with moderate changes also into the arch with what appears to be a possible tear of the fascia     Assessment:  Strong possibility of a tear of the plantar fascial left     Plan:  H&P x-ray reviewed at today I did go ahead and I want him to wear boot in order to mobilize and he does have 1.  I gave instructions for ice anti-inflammatories and will be seen back again in 4 weeks  X-rays indicate no signs of fracture or stress fracture associated with his injury

## 2018-11-06 ENCOUNTER — Encounter: Payer: Self-pay | Admitting: Podiatry

## 2018-11-06 ENCOUNTER — Other Ambulatory Visit: Payer: Self-pay

## 2018-11-06 ENCOUNTER — Ambulatory Visit (INDEPENDENT_AMBULATORY_CARE_PROVIDER_SITE_OTHER): Payer: No Typology Code available for payment source | Admitting: Podiatry

## 2018-11-06 VITALS — Temp 96.7°F

## 2018-11-06 DIAGNOSIS — M722 Plantar fascial fibromatosis: Secondary | ICD-10-CM | POA: Diagnosis not present

## 2018-11-06 DIAGNOSIS — T148XXA Other injury of unspecified body region, initial encounter: Secondary | ICD-10-CM

## 2018-11-06 NOTE — Progress Notes (Signed)
Subjective:   Patient ID: Kyle Gallegos, male   DOB: 53 y.o.   MRN: [id number]   HPI Patient states the left arch is still quite sore and there is been some pain also in the outside of the foot and is not been able to wear his boot to work.  It does feel relatively like he is lost some of his arch and he knows he is going to need more stability   ROS      Objective:  Physical Exam  Neurovascular status intact with patient's left arch showing obvious tear of the plantar fascia.  There is no bruising but it is quite sore in the mid arch area with moderate depression of the arch     Assessment:  Pathological tear of the plantar fascial left with moderate arch depression associated with it     Plan:  Reviewed condition the fact recovery can take approximately 12 to 16 weeks and continue boot and ice therapy as much as possible.  I then went ahead and casted for functional orthotics with ped orthotist to try to help give more stability and lift the arch structure.  Reappoint when ready

## 2018-12-15 ENCOUNTER — Encounter: Payer: No Typology Code available for payment source | Admitting: Orthotics

## 2019-07-17 ENCOUNTER — Other Ambulatory Visit: Payer: Self-pay | Admitting: Orthopedic Surgery

## 2019-07-17 DIAGNOSIS — M5412 Radiculopathy, cervical region: Secondary | ICD-10-CM

## 2019-07-23 ENCOUNTER — Other Ambulatory Visit: Payer: Self-pay

## 2019-07-23 ENCOUNTER — Ambulatory Visit
Admission: RE | Admit: 2019-07-23 | Discharge: 2019-07-23 | Disposition: A | Discharge status: Home / Self Care | Payer: No Typology Code available for payment source | Source: Ambulatory Visit | Attending: Orthopedic Surgery | Admitting: Orthopedic Surgery

## 2019-07-23 DIAGNOSIS — M5412 Radiculopathy, cervical region: Secondary | ICD-10-CM | POA: Insufficient documentation

## 2019-07-23 IMAGING — MR MR CERVICAL SPINE W/O CM
5 series · 41 of 48 positions shown · non-contrast
Comparison: None.

CLINICAL DATA: Right cervical radiculopathy

EXAM:
MRI CERVICAL SPINE WITHOUT CONTRAST
TECHNIQUE: Multiplanar, multisequence MR imaging of the cervical spine was
performed. No intravenous contrast was administered.

[Series 5: T2 · sagittal · 3.0mm · 0.62mm/px · 6 of 15 slices shown (1 of 2)]
[im 1/15]
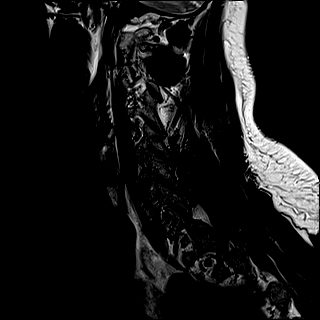
[im 3/15]
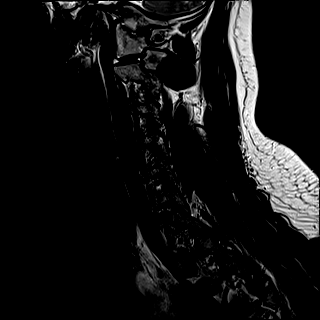
[im 6/15]
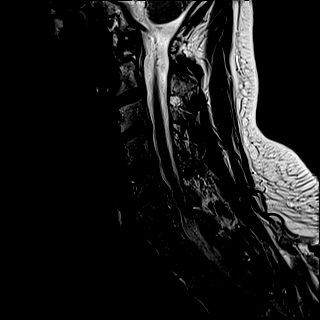
[im 9/15]
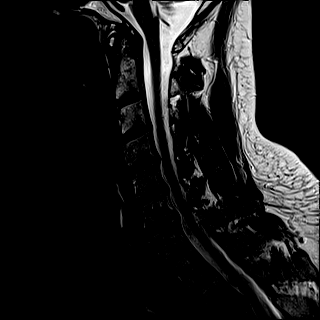
[im 12/15]
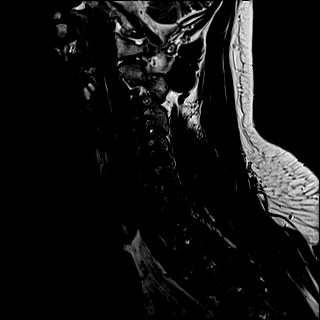
[im 15/15]
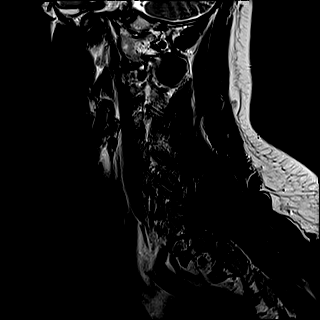

[Series 6: FLAIR · sagittal · 3.0mm · 0.78mm/px · 6 of 15 slices shown]
[im 1/15]
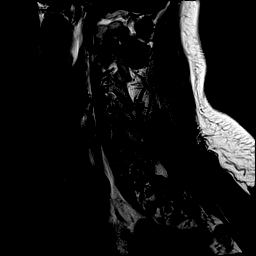
[im 3/15]
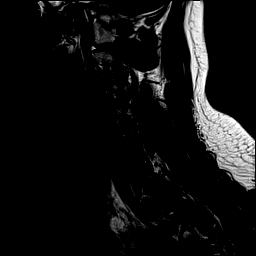
[im 6/15]
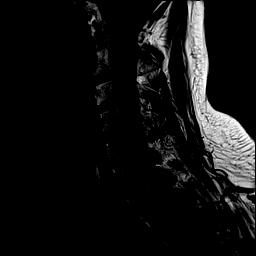
[im 9/15]
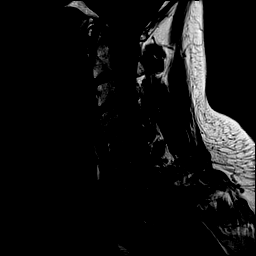
[im 12/15]
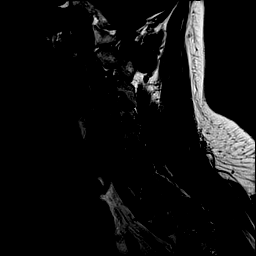
[im 15/15]
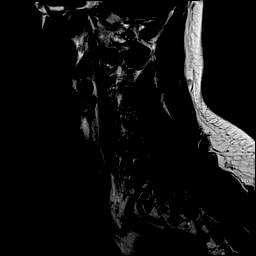

[Series 7: STIR · sagittal · 3.0mm · 0.62mm/px · 6 of 15 slices shown]
[im 1/15]
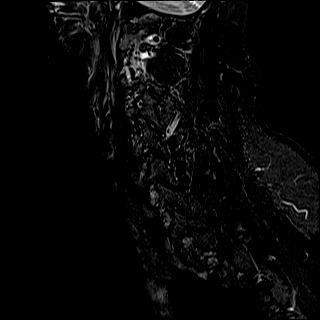
[im 3/15]
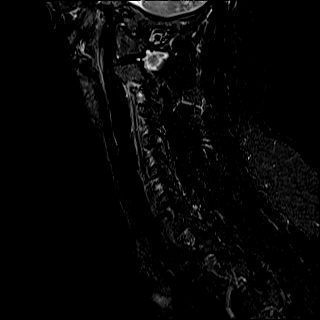
[im 6/15]
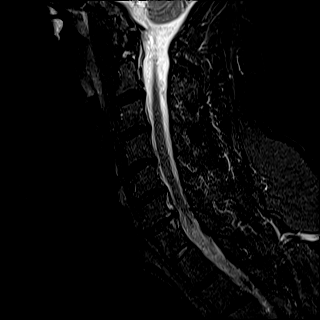
[im 9/15]
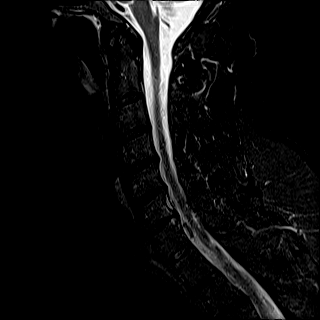
[im 12/15]
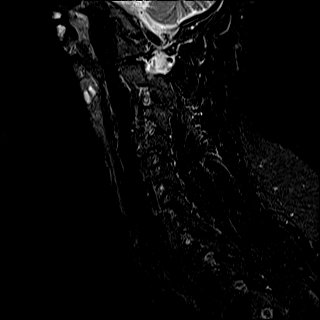
[im 15/15]
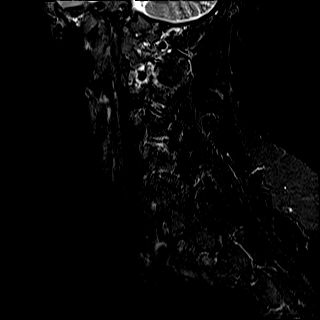

[Series 8: T2 · axial · 3.0mm · 0.70mm/px · z∈[-100,+16]mm · 15 of 35 slices shown (2 of 2)]
[im 1/35]
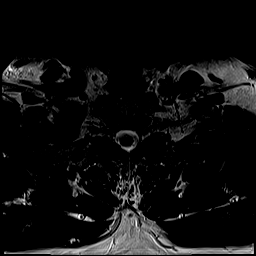
[im 3/35]
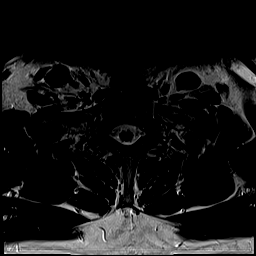
[im 5/35]
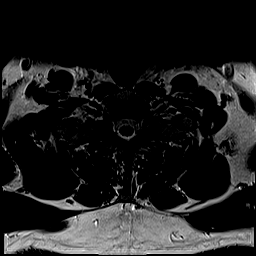
[im 8/35]
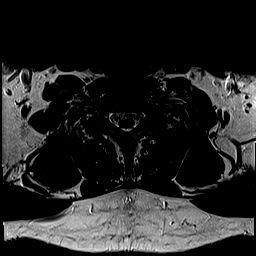
[im 10/35]
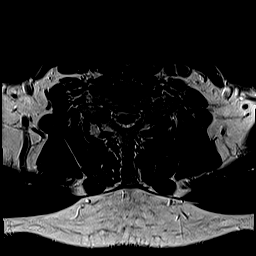
[im 13/35]
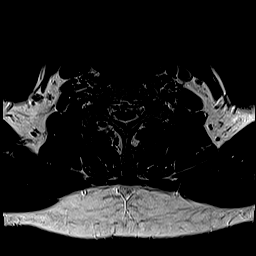
[im 15/35]
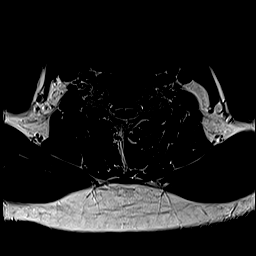
[im 18/35]
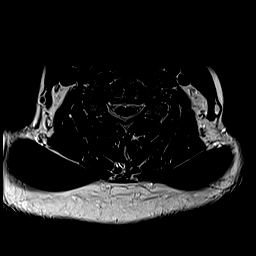
[im 20/35]
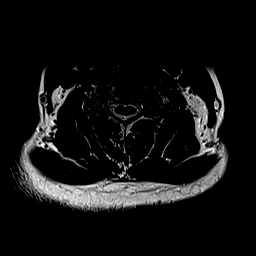
[im 22/35]
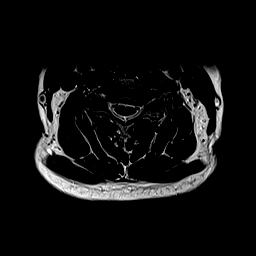
[im 25/35]
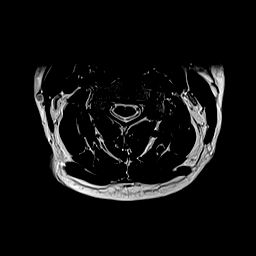
[im 27/35]
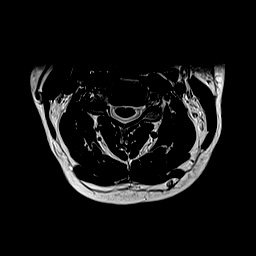
[im 30/35]
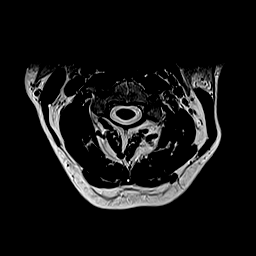
[im 32/35]
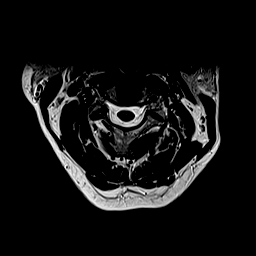
[im 35/35]
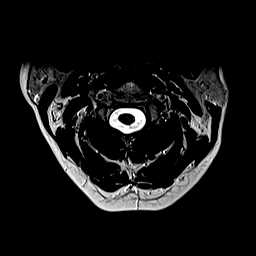

[Series 9: ax mpgr · axial · 3.0mm · 0.35mm/px · z∈[-100,+16]mm · 8 of 35 slices shown]
[im 1/35]
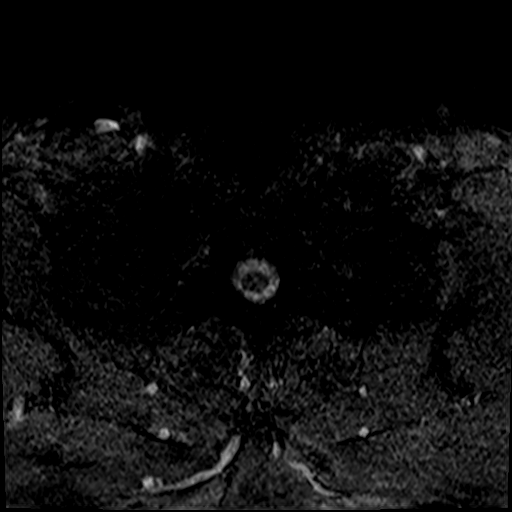
[im 5/35]
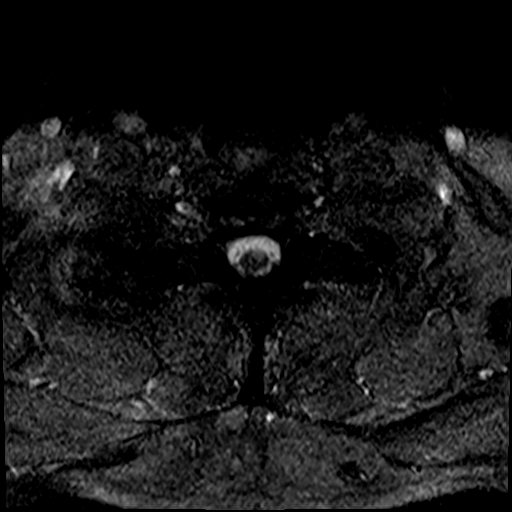
[im 10/35]
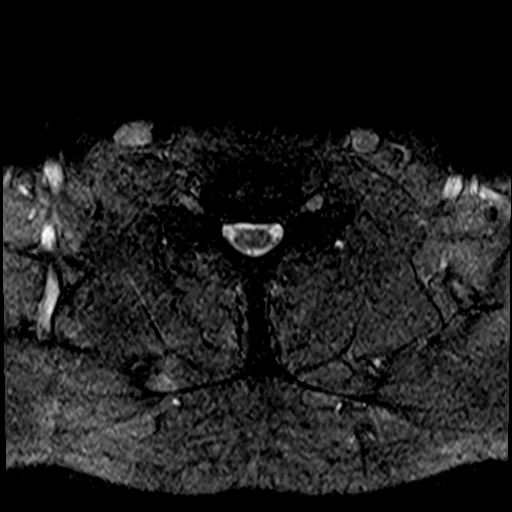
[im 15/35]
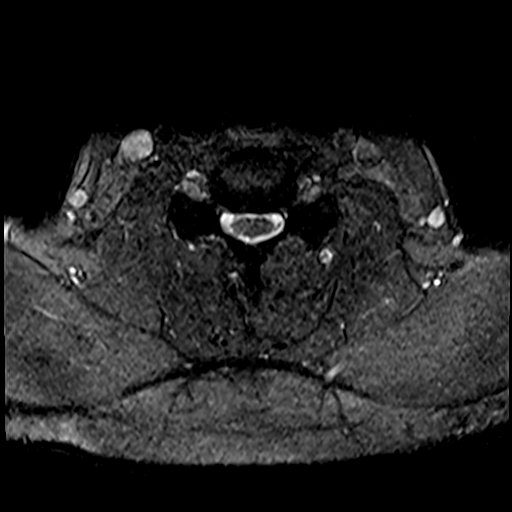
[im 20/35]
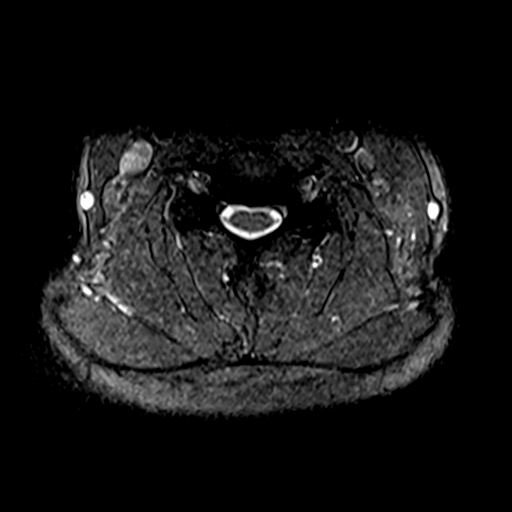
[im 25/35]
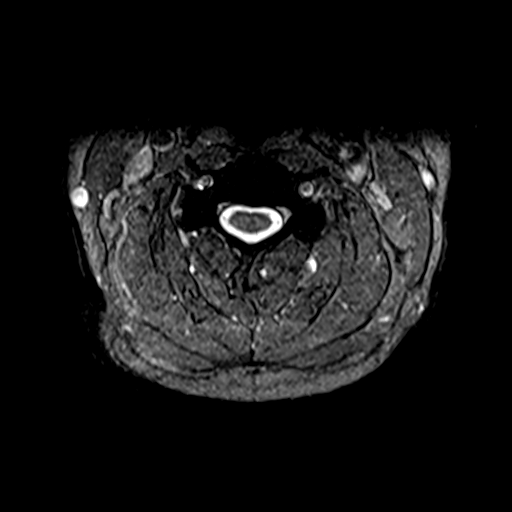
[im 30/35]
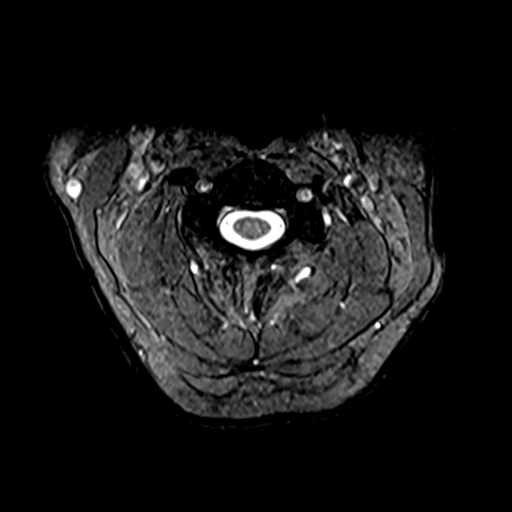
[im 35/35]
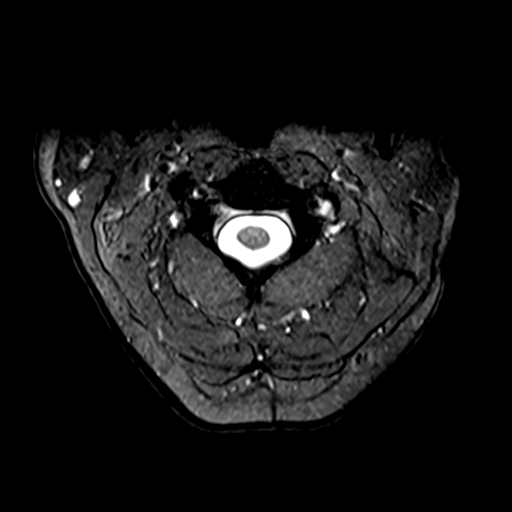

[41 of 48 positions shown; findings below may reference images not displayed]

FINDINGS: Alignment: Physiologic

Vertebrae: No fracture, evidence of discitis, or bone lesion.

Cord: Normal signal and morphology.

Posterior Fossa, vertebral arteries, paraspinal tissues: Negative.

Disc levels:

C2-3: Unremarkable.

C3-4: Minor disc narrowing and uncovertebral ridging with bulge. No
impingement

C4-5: Disc narrowing and bulging with small uncovertebral spurs.
Mild borderline moderate left foraminal narrowing

C5-6: Disc narrowing and bulging with uncovertebral ridging. Mild
narrowing of both foramina

C6-7: Disc narrowing and bulging with a right foraminal protrusion
impinging on the C7 nerve. Bulging disc and uncovertebral spur also
causes moderate left foraminal impingement

C7-T1:Unremarkable.
IMPRESSION: 1. C6-7 right foraminal herniation with C7 impingement, likely
related to the presenting complaint.
2. C6-7 moderate left foraminal impingement.
3. Disc degeneration at C4-5 and C5-6 as described

## 2019-07-26 ENCOUNTER — Ambulatory Visit: Payer: No Typology Code available for payment source

## 2019-07-29 ENCOUNTER — Ambulatory Visit: Payer: No Typology Code available for payment source

## 2019-07-30 ENCOUNTER — Other Ambulatory Visit: Payer: Self-pay | Admitting: Neurosurgery

## 2019-08-06 ENCOUNTER — Other Ambulatory Visit: Payer: Self-pay

## 2019-08-06 ENCOUNTER — Other Ambulatory Visit
Admission: RE | Admit: 2019-08-06 | Discharge: 2019-08-06 | Disposition: A | Discharge status: Home / Self Care | Payer: No Typology Code available for payment source | Source: Ambulatory Visit | Attending: Neurosurgery | Admitting: Neurosurgery

## 2019-08-06 DIAGNOSIS — Z01818 Encounter for other preprocedural examination: Secondary | ICD-10-CM | POA: Insufficient documentation

## 2019-08-06 HISTORY — DX: Personal history of urinary calculi: Z87.442

## 2019-08-06 HISTORY — DX: Depression, unspecified: F32.A

## 2019-08-06 NOTE — Patient Instructions (Signed)
Your procedure is scheduled on: Wednesday August 12, 2019  Report to Day Surgery inside [facility]. To find out your arrival time please call [phone] between 1PM - 3PM on Tuesday August 11, 2019.  Remember: Instructions that are not followed completely may result in serious medical risk,  up to and including death, or upon the discretion of your surgeon and anesthesiologist your  surgery may need to be rescheduled.     _X__ 1. Do not eat food after midnight the night before your procedure.                 No gum chewing or hard candies. You may drink clear liquids up to 2 hours                 before you are scheduled to arrive for your surgery- DO not drink clear                 liquids within 2 hours of the start of your surgery.                 Clear Liquids include:  water, apple juice without pulp, clear Gatorade, G2 or                  Gatorade Zero (avoid Red/Purple/Blue), Black Coffee or Tea (Do not add                 anything to coffee or tea).  __X__2.  On the morning of surgery brush your teeth with toothpaste and water, you                may rinse your mouth with mouthwash if you wish.  Do not swallow any toothpaste of mouthwash.     _X__ 3.  No Alcohol for 24 hours before or after surgery.   _X__ 4.  Do Not Smoke or use e-cigarettes For 24 Hours Prior to Your Surgery.                 Do not use any chewable tobacco products for at least 6 hours prior to                 surgery.  __x__ 5.  Notify your doctor if there is any change in your medical condition      (cold, fever, infections).     Do not wear jewelry, make-up, hairpins, clips or nail polish. Do not wear lotions, powders, or perfumes. You may wear deodorant. Do not shave 48 hours prior to surgery. Men may shave face and neck. Do not bring valuables to the hospital.    [facility] is not responsible for any belongings or valuables.  Contacts, dentures or bridgework may not be  worn into surgery. Leave your suitcase in the car. After surgery it may be brought to your room. For patients admitted to the hospital, discharge time is determined by your treatment team.   Patients discharged the day of surgery will not be allowed to drive home.   Make arrangements for someone to be with you for the first 24 hours of your Same Day Discharge.   __X__ Take these medicines the morning of surgery with A SIP OF WATER:    1. gabapentin (NEURONTIN) 300  2. escitalopram (LEXAPRO) 10 MG  3. pantoprazole (PROTONIX) 40 MG   __X__ Use CHG Soap (or wipes) as directed  __X__ Stop Anti-inflammatories such as Ibuprofen, Aleve, naproxen, aspirin and or BC powders.  __X__ Stop supplements until after surgery.    __X__ Do not start any herbal supplements before your surgery.

## 2019-08-07 ENCOUNTER — Encounter
Admission: RE | Admit: 2019-08-07 | Discharge: 2019-08-07 | Disposition: A | Discharge status: Home / Self Care | Payer: No Typology Code available for payment source | Source: Ambulatory Visit | Attending: Neurosurgery | Admitting: Neurosurgery

## 2019-08-07 DIAGNOSIS — Z01812 Encounter for preprocedural laboratory examination: Secondary | ICD-10-CM | POA: Diagnosis present

## 2019-08-07 LAB — TYPE AND SCREEN
ABO/RH(D): A NEG
Antibody Screen: NEGATIVE

## 2019-08-07 LAB — CBC
HCT: 43.3 % (ref 39.0–52.0)
Hemoglobin: 13.9 g/dL (ref 13.0–17.0)
MCH: 29.8 pg (ref 26.0–34.0)
MCHC: 32.1 g/dL (ref 30.0–36.0)
MCV: 92.9 fL (ref 80.0–100.0)
Platelets: 216 10*3/uL (ref 150–400)
RBC: 4.66 MIL/uL (ref 4.22–5.81)
RDW: 12.6 % (ref 11.5–15.5)
WBC: 6.1 10*3/uL (ref 4.0–10.5)
nRBC: 0 % (ref 0.0–0.2)

## 2019-08-07 LAB — URINALYSIS, ROUTINE W REFLEX MICROSCOPIC
Bilirubin Urine: NEGATIVE
Glucose, UA: NEGATIVE mg/dL
Hgb urine dipstick: NEGATIVE
Ketones, ur: NEGATIVE mg/dL
Leukocytes,Ua: NEGATIVE
Nitrite: NEGATIVE
Protein, ur: NEGATIVE mg/dL
Specific Gravity, Urine: 1.018 (ref 1.005–1.030)
pH: 6 (ref 5.0–8.0)

## 2019-08-07 LAB — BASIC METABOLIC PANEL
Anion gap: 9 (ref 5–15)
BUN: 19 mg/dL (ref 6–20)
CO2: 24 mmol/L (ref 22–32)
Calcium: 9.2 mg/dL (ref 8.9–10.3)
Chloride: 106 mmol/L (ref 98–111)
Creatinine, Ser: 1.3 mg/dL — ABNORMAL HIGH (ref 0.61–1.24)
GFR calc Af Amer: >60 mL/min (ref >60)
GFR calc non Af Amer: >60 mL/min (ref >60)
Glucose, Bld: 101 mg/dL — ABNORMAL HIGH (ref 70–99)
Potassium: 3.9 mmol/L (ref 3.5–5.1)
Sodium: 139 mmol/L (ref 135–145)

## 2019-08-07 LAB — SURGICAL PCR SCREEN
MRSA, PCR: NEGATIVE
Staphylococcus aureus: POSITIVE — AB

## 2019-08-07 LAB — PROTIME-INR
INR: 0.9 (ref 0.8–1.2)
Prothrombin Time: 12.4 seconds (ref 11.4–15.2)

## 2019-08-07 LAB — APTT: aPTT: 28 seconds (ref 24–36)

## 2019-08-10 ENCOUNTER — Other Ambulatory Visit
Admission: RE | Admit: 2019-08-10 | Discharge: 2019-08-10 | Disposition: A | Discharge status: Home / Self Care | Payer: No Typology Code available for payment source | Source: Ambulatory Visit | Attending: Neurosurgery | Admitting: Neurosurgery

## 2019-08-10 ENCOUNTER — Other Ambulatory Visit: Payer: Self-pay

## 2019-08-10 DIAGNOSIS — Z01812 Encounter for preprocedural laboratory examination: Secondary | ICD-10-CM | POA: Insufficient documentation

## 2019-08-10 DIAGNOSIS — Z20822 Contact with and (suspected) exposure to covid-19: Secondary | ICD-10-CM | POA: Insufficient documentation

## 2019-08-10 LAB — SARS CORONAVIRUS 2 (TAT 6-24 HRS): SARS Coronavirus 2: NEGATIVE

## 2019-08-12 ENCOUNTER — Ambulatory Visit
Admission: RE | Admit: 2019-08-12 | Discharge: 2019-08-12 | Disposition: A | Discharge status: Home / Self Care | Payer: No Typology Code available for payment source | Attending: Neurosurgery | Admitting: Neurosurgery

## 2019-08-12 ENCOUNTER — Ambulatory Visit: Payer: No Typology Code available for payment source

## 2019-08-12 ENCOUNTER — Encounter
Admission: RE | Disposition: A | Discharge status: Home / Self Care | Payer: Self-pay | Source: Home / Self Care | Attending: Neurosurgery

## 2019-08-12 ENCOUNTER — Encounter: Payer: Self-pay | Admitting: Neurosurgery

## 2019-08-12 DIAGNOSIS — M50123 Cervical disc disorder at C6-C7 level with radiculopathy: Secondary | ICD-10-CM | POA: Diagnosis not present

## 2019-08-12 DIAGNOSIS — Z981 Arthrodesis status: Secondary | ICD-10-CM

## 2019-08-12 DIAGNOSIS — Z79899 Other long term (current) drug therapy: Secondary | ICD-10-CM | POA: Diagnosis not present

## 2019-08-12 DIAGNOSIS — E785 Hyperlipidemia, unspecified: Secondary | ICD-10-CM | POA: Diagnosis not present

## 2019-08-12 DIAGNOSIS — M5412 Radiculopathy, cervical region: Secondary | ICD-10-CM | POA: Diagnosis present

## 2019-08-12 DIAGNOSIS — Z419 Encounter for procedure for purposes other than remedying health state, unspecified: Secondary | ICD-10-CM

## 2019-08-12 HISTORY — PX: ANTERIOR CERVICAL DECOMP/DISCECTOMY FUSION: SHX1161

## 2019-08-12 LAB — ABO/RH: ABO/RH(D): A NEG

## 2019-08-12 IMAGING — CR DG CERVICAL SPINE 2 OR 3 VIEWS
1 series · 2 of 2 positions shown · non-contrast
Comparison: Intraoperative imaging of earlier today.

CLINICAL DATA: Anterior cervical decompression at C6-7.

EXAM:
CERVICAL SPINE - 2-3 VIEW

[Series 1: dg cervical spine 2 or 3 views · 0.14mm/px · 2 of 2 slices shown]
[im 1/2]
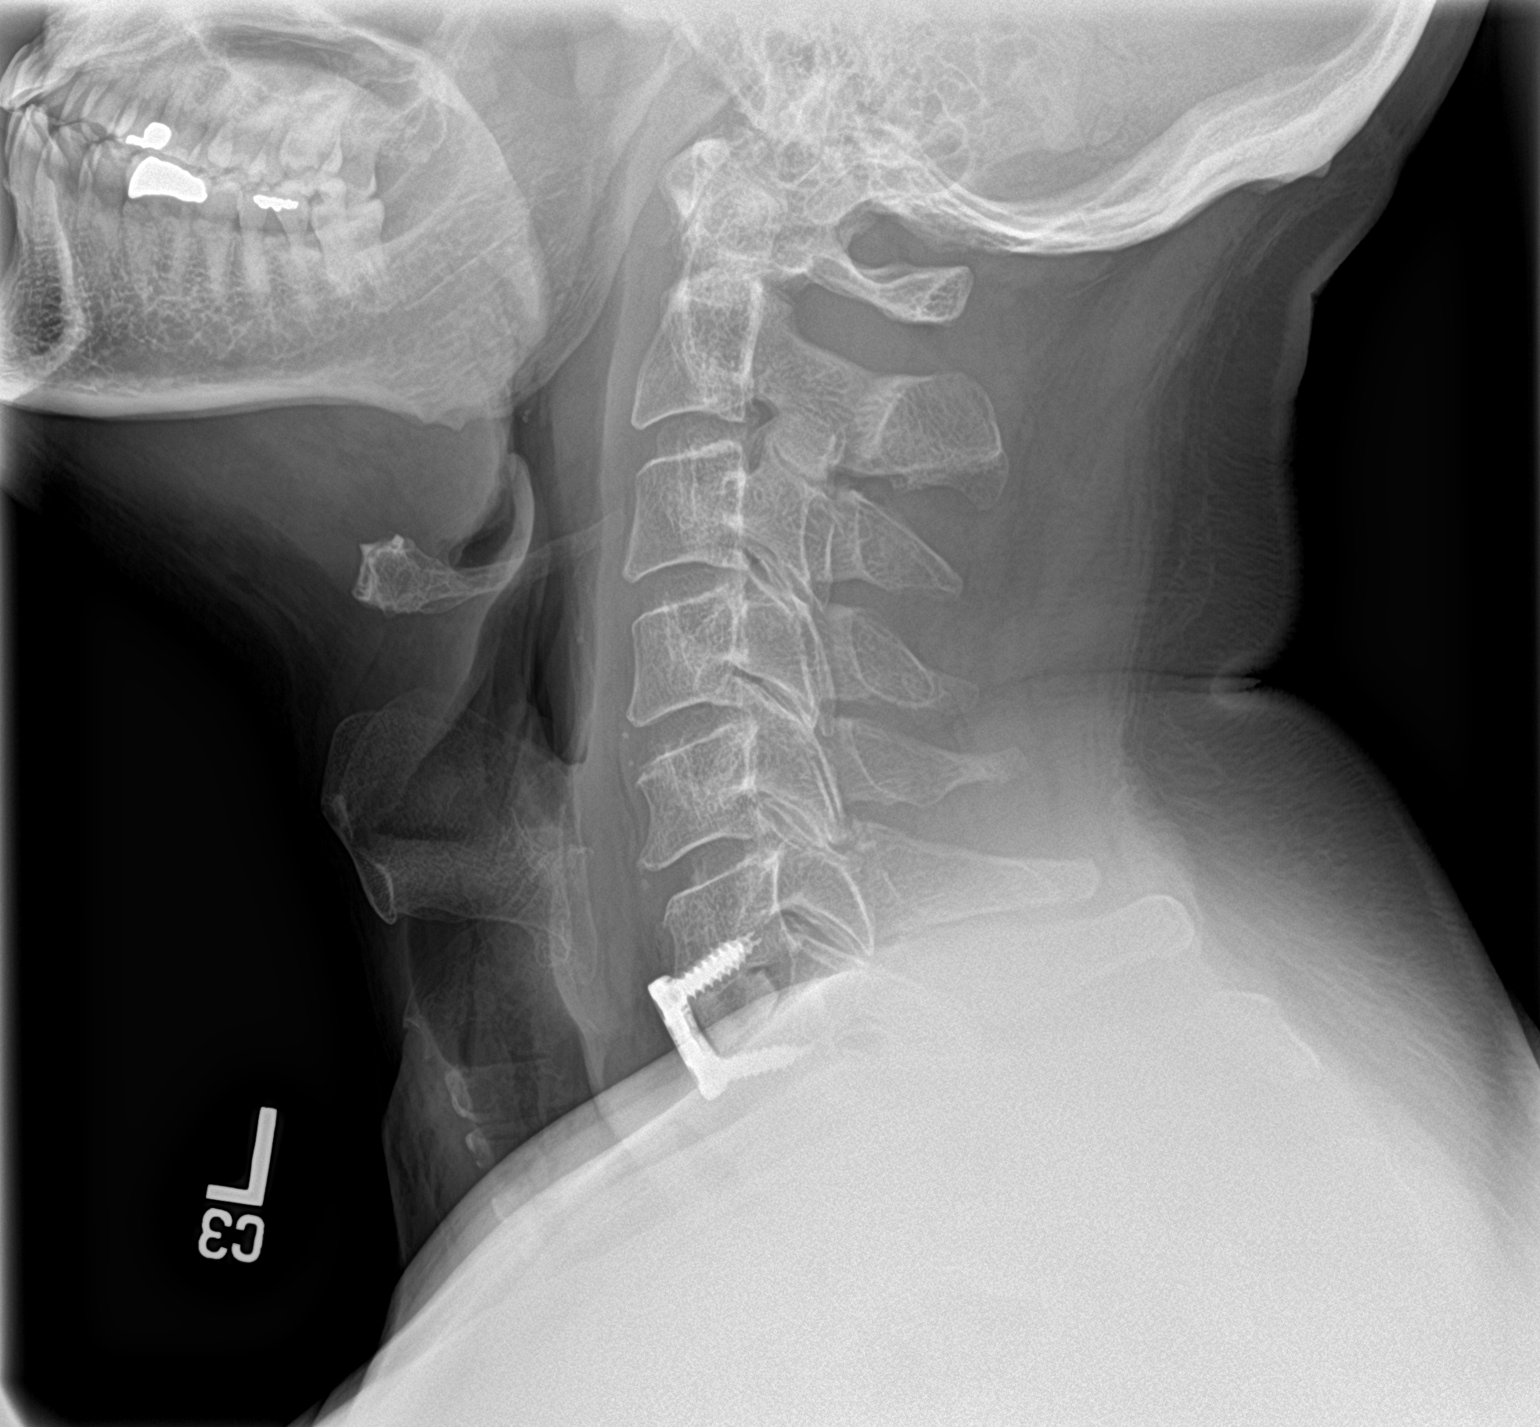
[im 2/2]
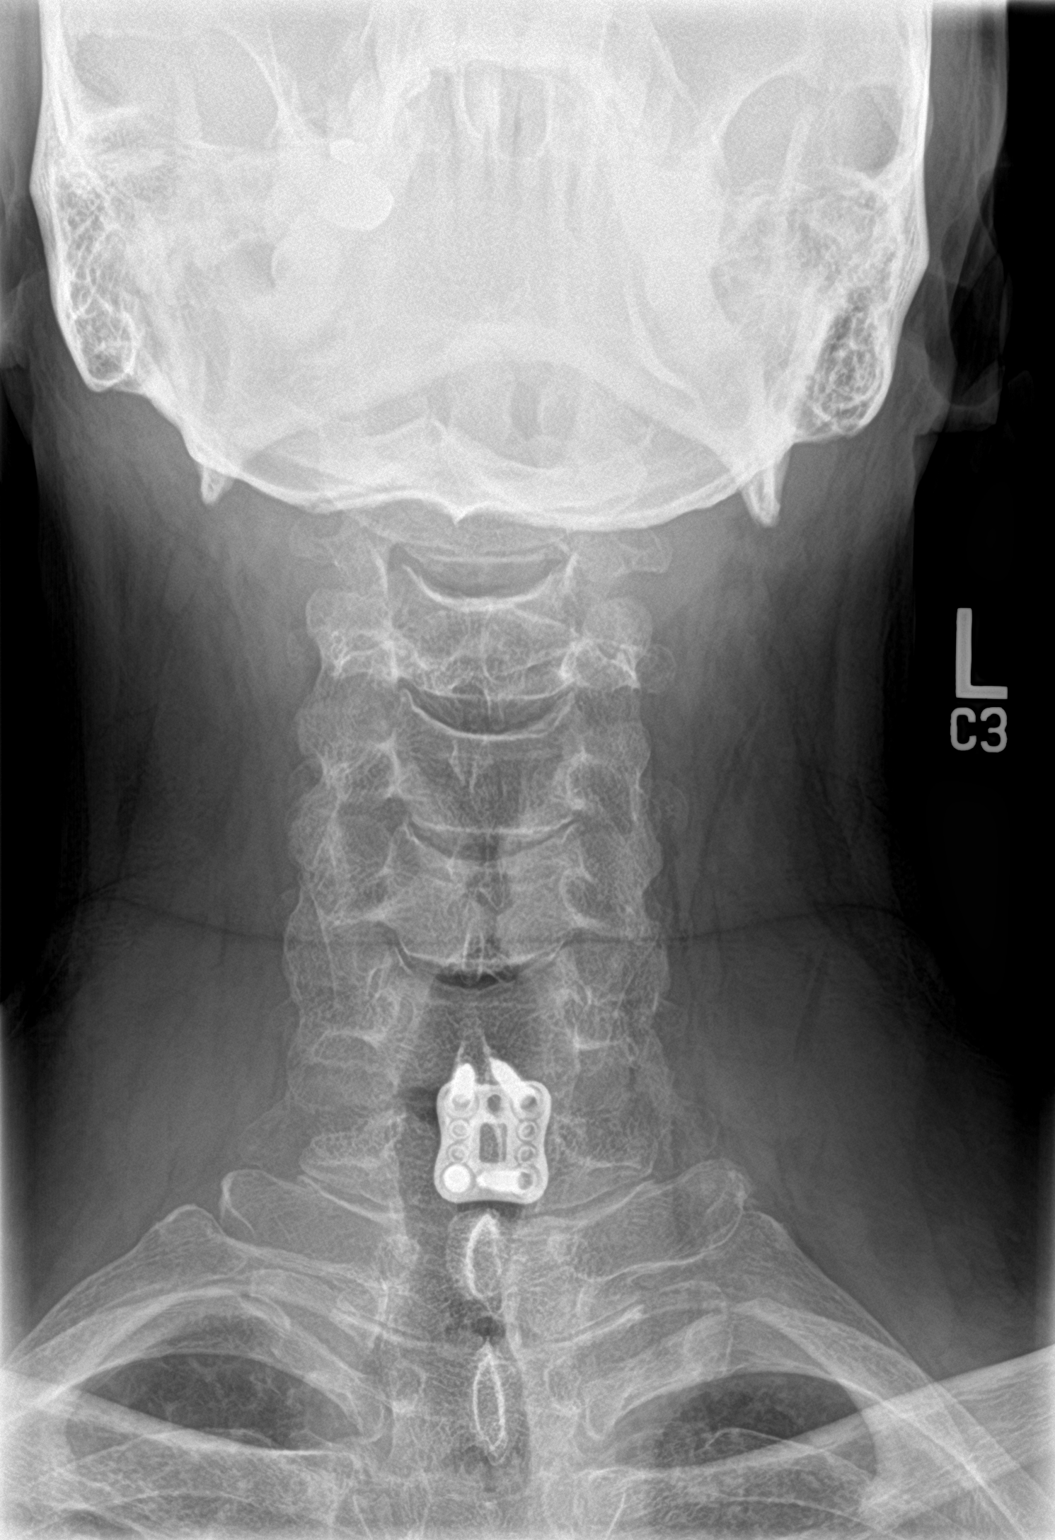

[2 of 2 positions shown; findings below may reference images not displayed]

FINDINGS: Anterior plate and screw fixation at C6-7. The operative level is
partially obscured by overlying soft tissues on the lateral. No
acute hardware complication identified. Maintenance of vertebral
body height and alignment across the imaged levels.
IMPRESSION: Expected appearance after anterior fixation at C6-7.

## 2019-08-12 IMAGING — RF DG C-ARM 1-60 MIN-NO REPORT
1 series · 5 of 5 positions shown · non-contrast
Comparison: [DATE].

CLINICAL DATA: Elective cervical surgery.

EXAM:
CERVICAL SPINE - 2-3 VIEW; DG C-ARM 1-60 MIN-NO REPORT
Radiation exposure index: 4.0252 mGy.

[Series 1: dg no report - auto finalize · 0.20mm/px · 5 of 5 slices shown]
[im 1/5]
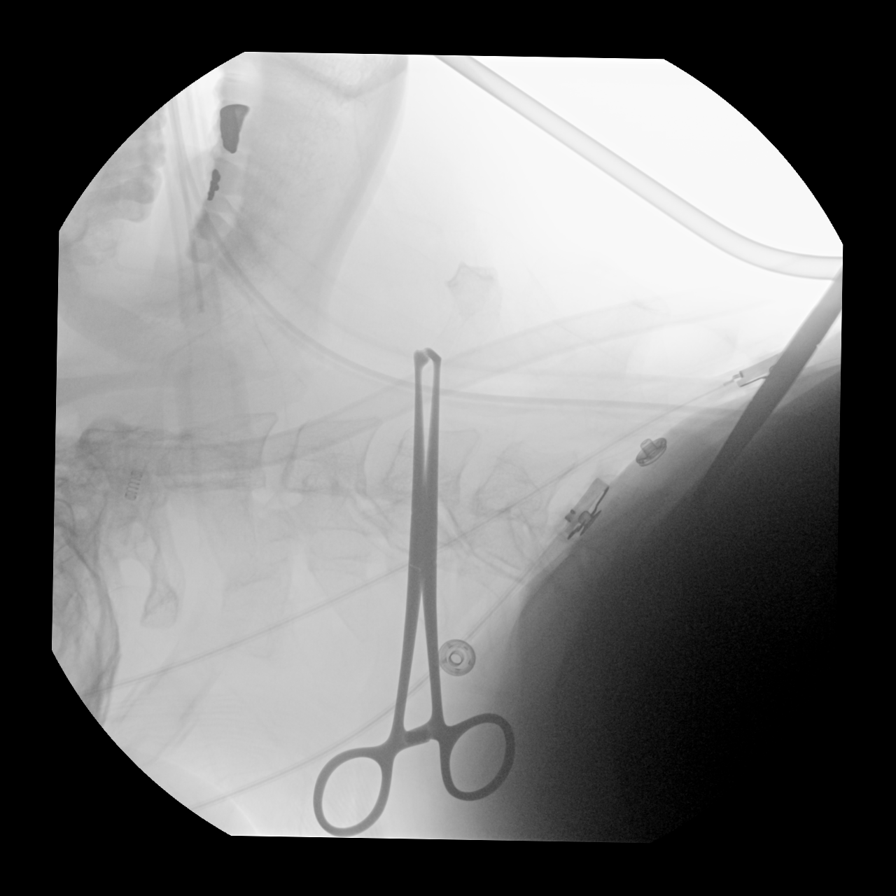
[im 2/5]
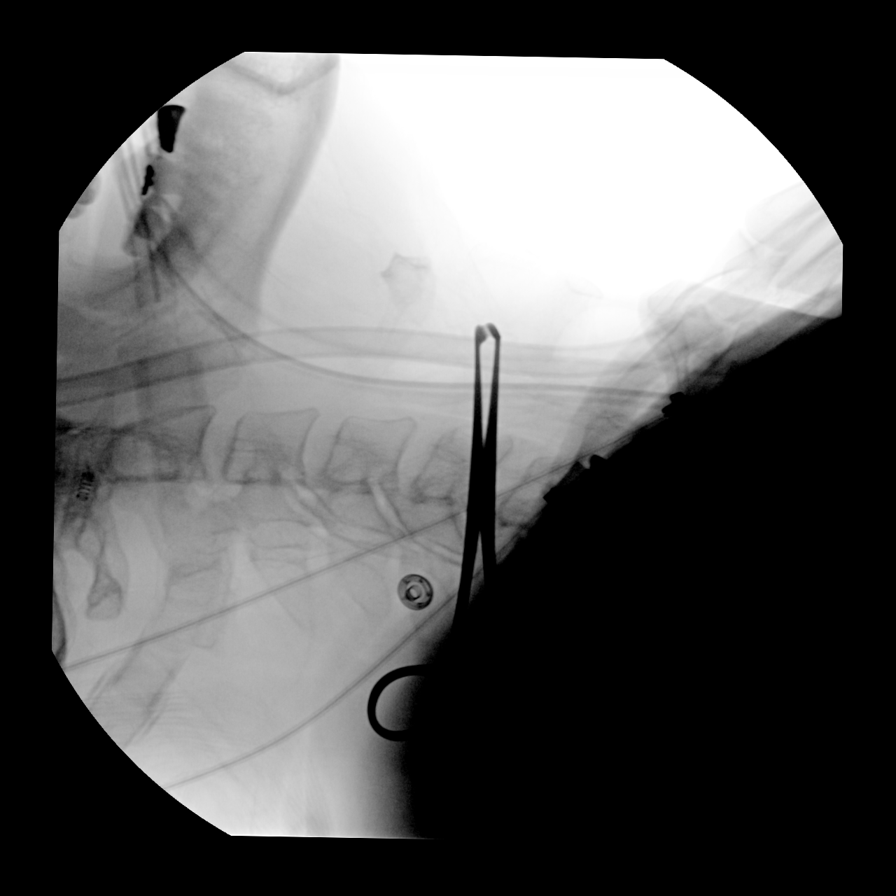
[im 3/5]
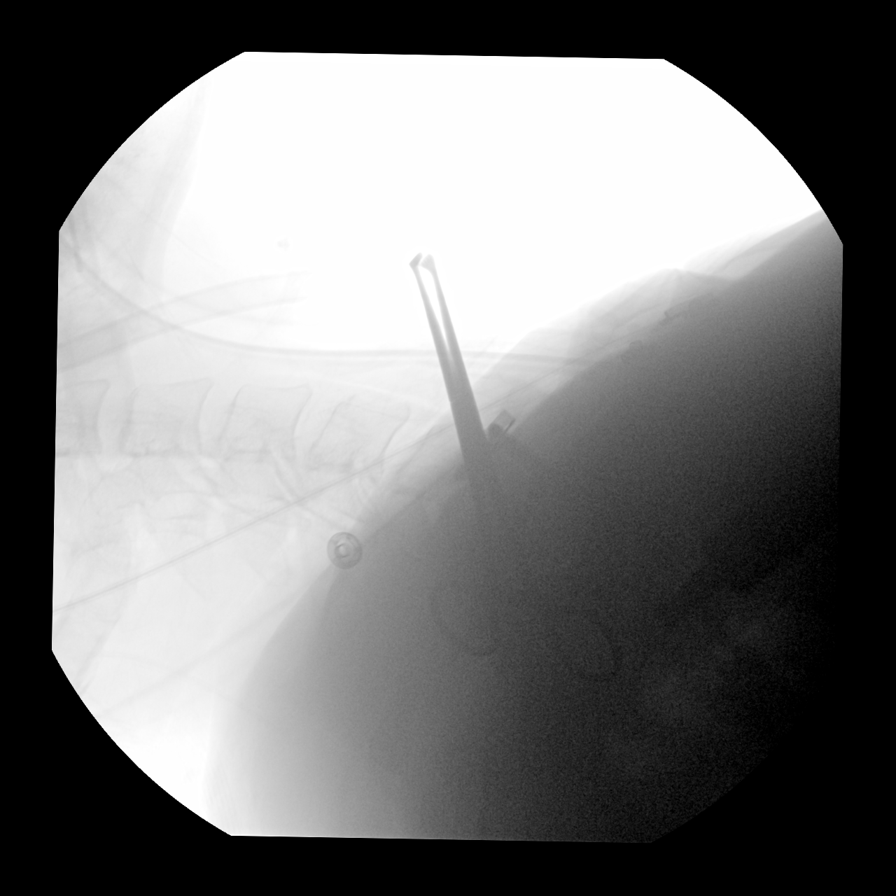
[im 4/5]
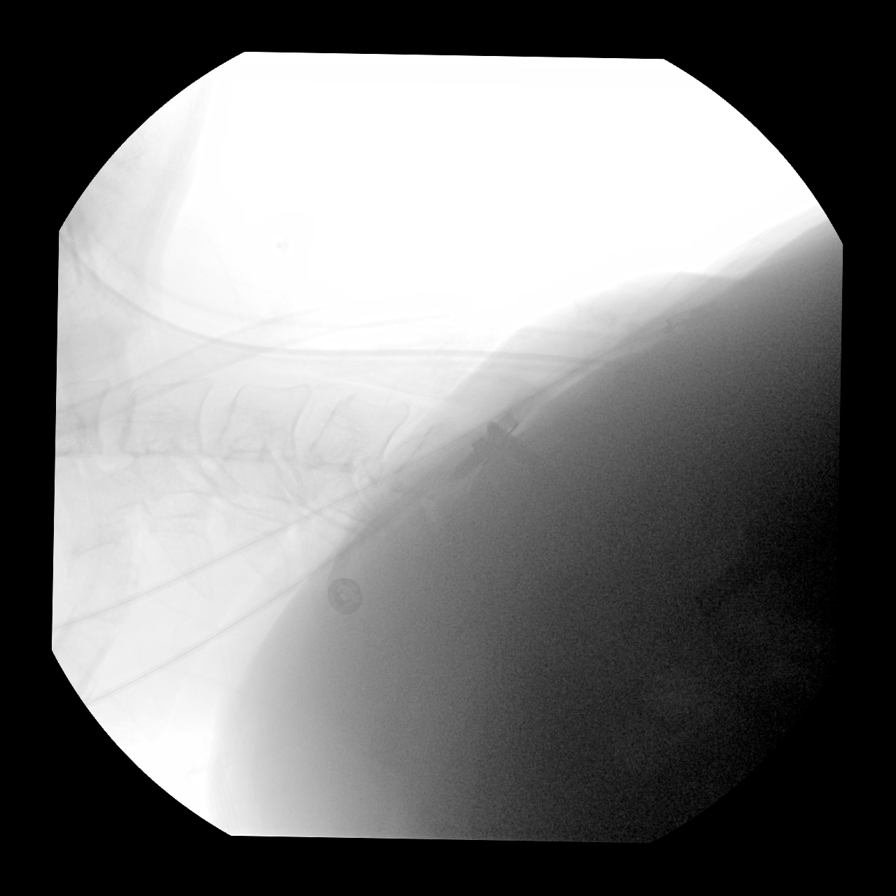
[im 5/5]
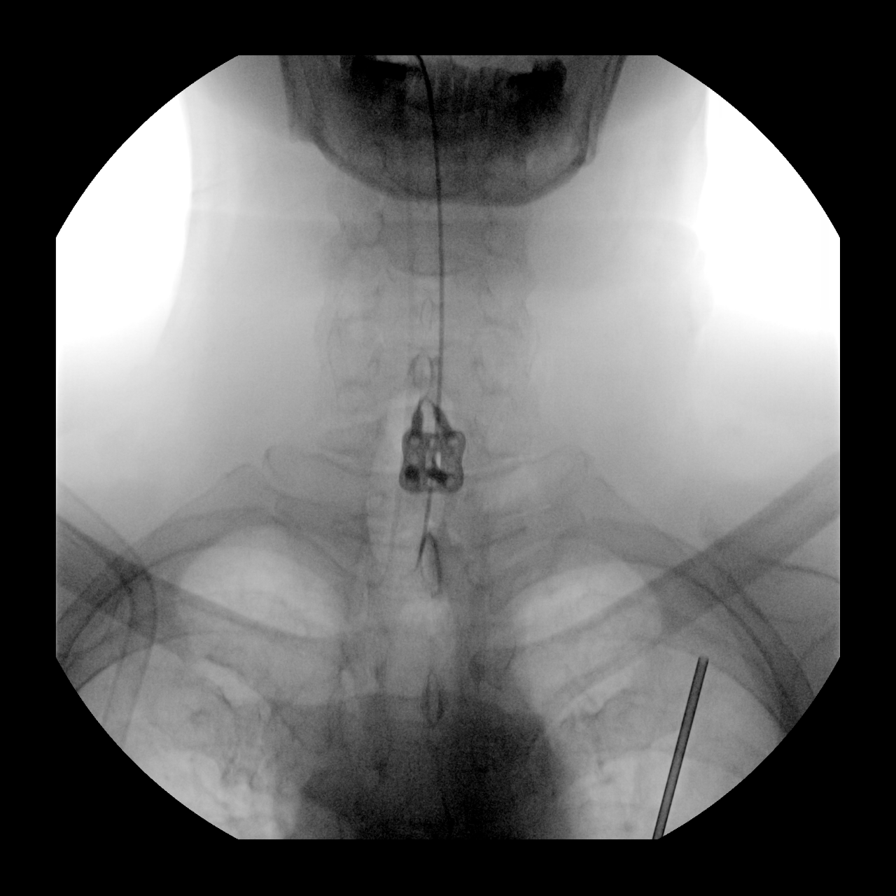

[5 of 5 positions shown; findings below may reference images not displayed]

FINDINGS: Five intraoperative fluoroscopic images were obtained of the
cervical spine. These images demonstrate the patient be status post
surgical anterior fusion of C6-7.
IMPRESSION: Status post surgical anterior fusion of C6-7.

## 2019-08-12 IMAGING — RF DG CERVICAL SPINE 2 OR 3 VIEWS
1 series · 5 of 5 positions shown · non-contrast
Comparison: [DATE].

CLINICAL DATA: Elective cervical surgery.

EXAM:
CERVICAL SPINE - 2-3 VIEW; DG C-ARM 1-60 MIN-NO REPORT
Radiation exposure index: 4.0252 mGy.

[Series 1: dg no report - auto finalize · 0.20mm/px · 5 of 5 slices shown]
[im 1/5]
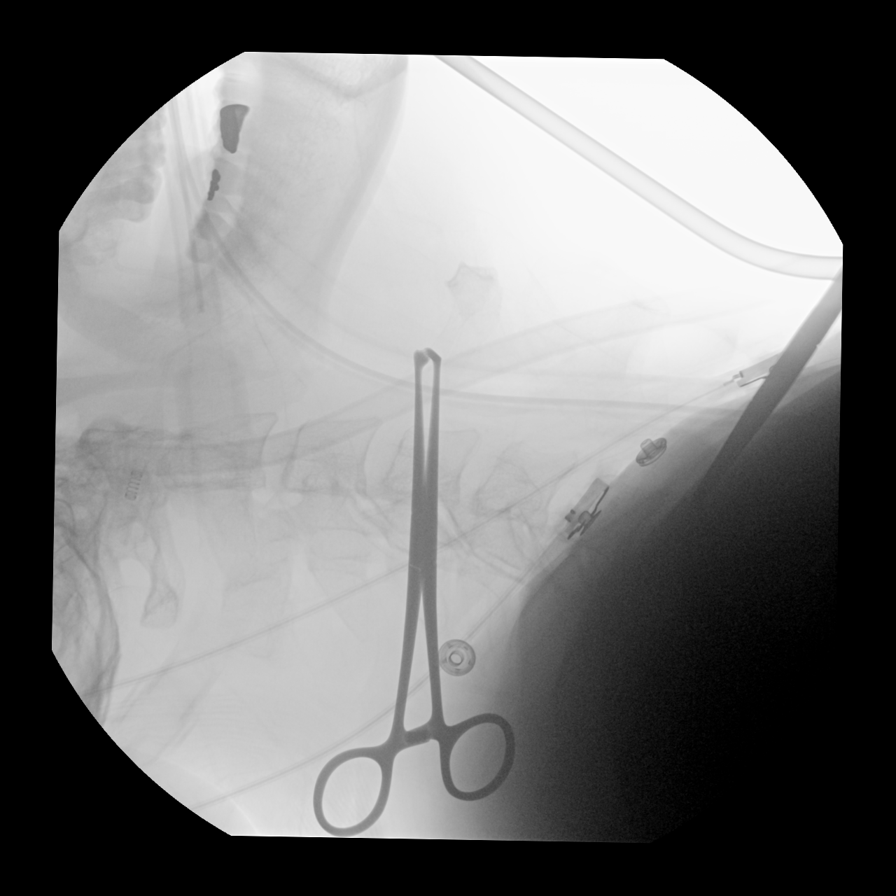
[im 2/5]
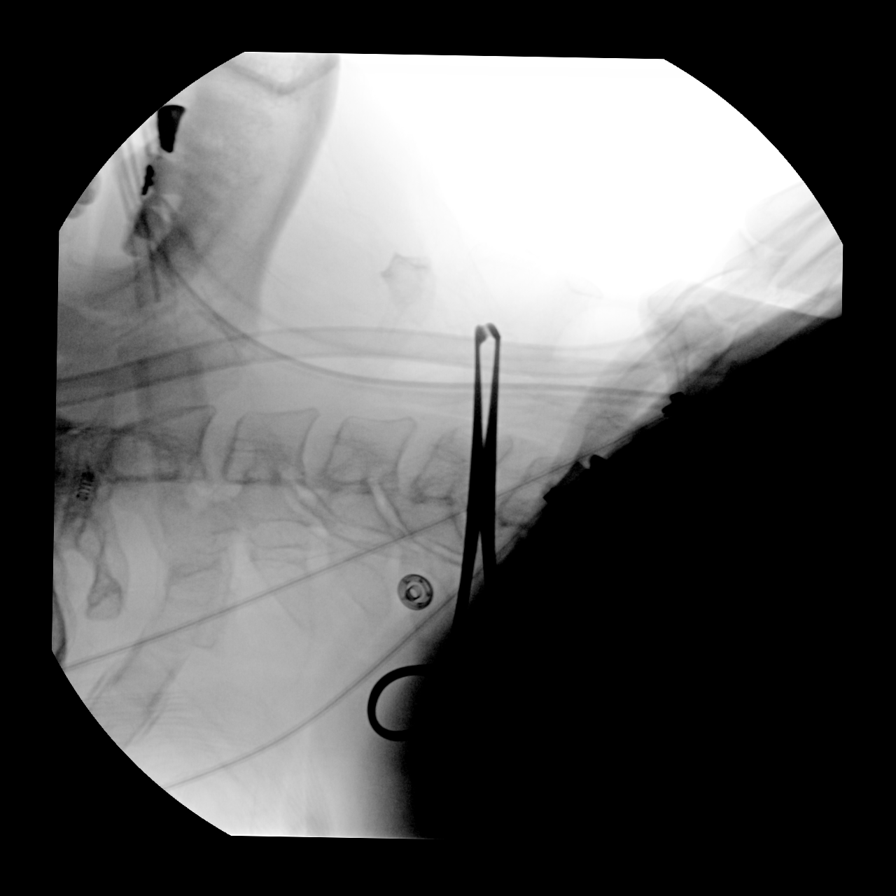
[im 3/5]
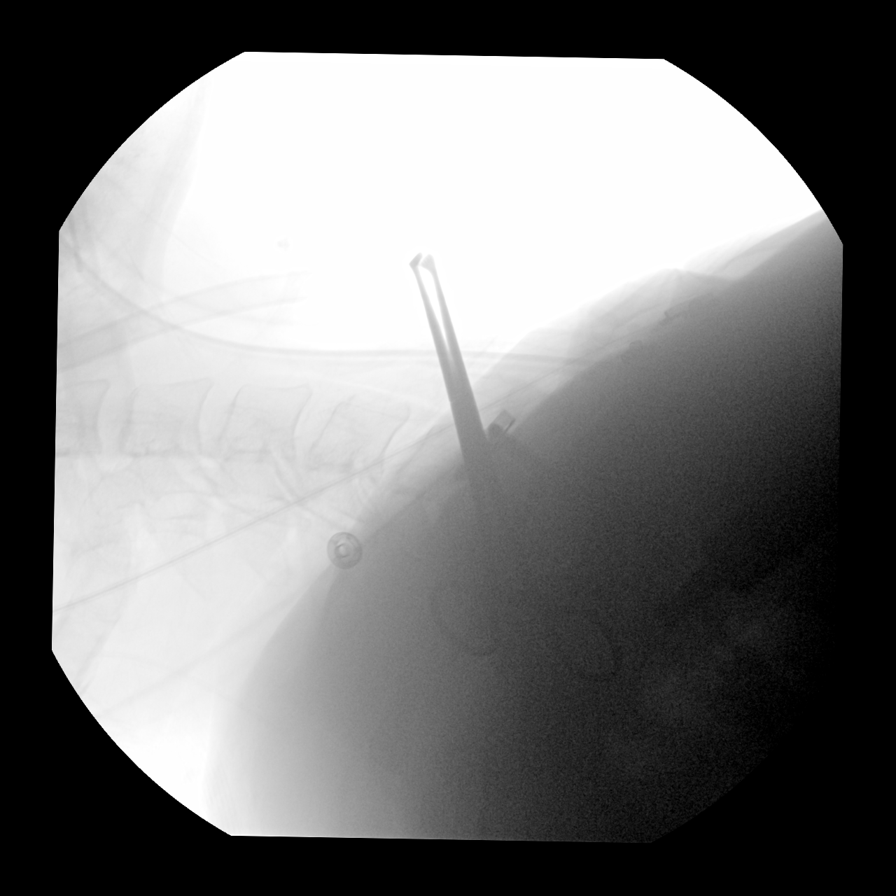
[im 4/5]
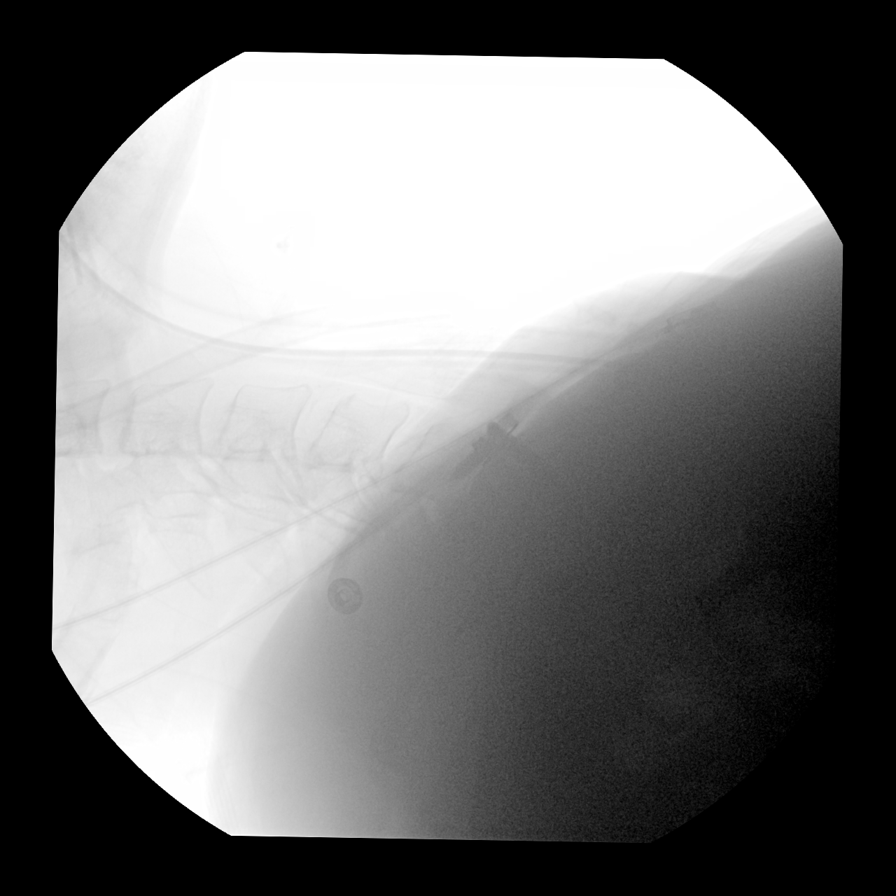
[im 5/5]
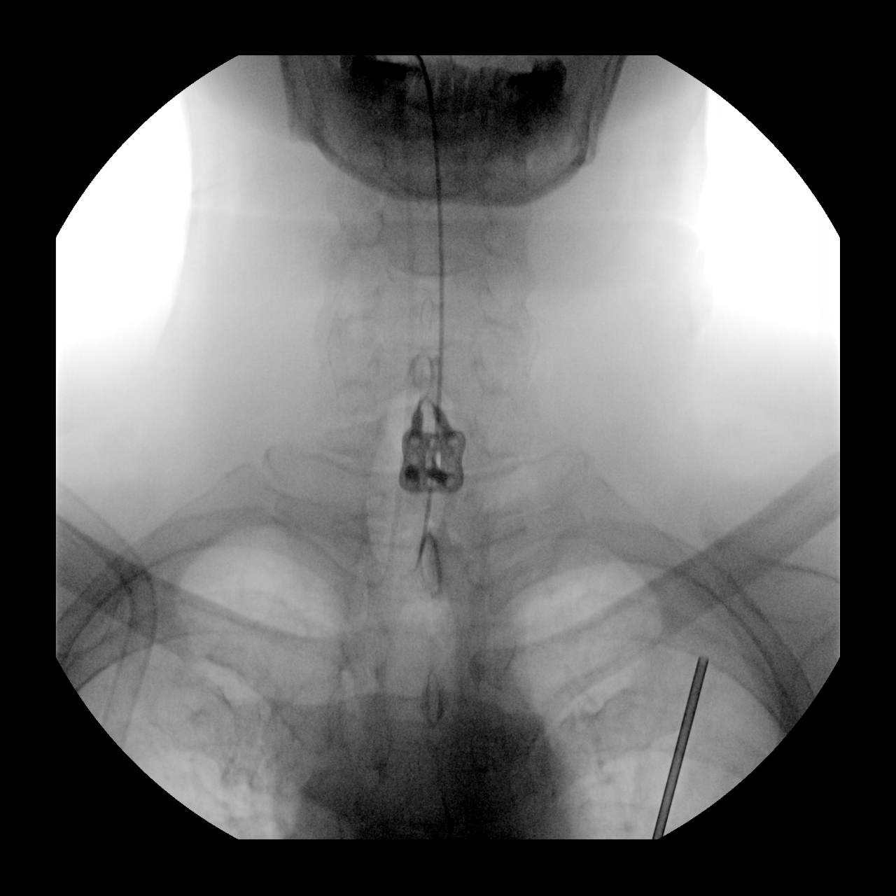

[5 of 5 positions shown; findings below may reference images not displayed]

FINDINGS: Five intraoperative fluoroscopic images were obtained of the
cervical spine. These images demonstrate the patient be status post
surgical anterior fusion of C6-7.
IMPRESSION: Status post surgical anterior fusion of C6-7.

## 2019-08-12 SURGERY — ANTERIOR CERVICAL DECOMPRESSION/DISCECTOMY FUSION 1 LEVEL
Anesthesia: General

## 2019-08-12 MED ORDER — MIDAZOLAM HCL 2 MG/2ML IJ SOLN
INTRAMUSCULAR | Status: DC | PRN
  Administered 2019-08-12: 2 mg via INTRAVENOUS

## 2019-08-12 MED ORDER — ONDANSETRON HCL 4 MG/2ML IJ SOLN
INTRAMUSCULAR | Status: DC | PRN
  Administered 2019-08-12: 4 mg via INTRAVENOUS

## 2019-08-12 MED ORDER — CEFAZOLIN SODIUM-DEXTROSE 2-4 GM/100ML-% IV SOLN
2.0000 g | Freq: Once | INTRAVENOUS | Status: AC
  Administered 2019-08-12: 08:00:00 2 g via INTRAVENOUS

## 2019-08-12 MED ORDER — PROPOFOL 10 MG/ML IV BOLUS
INTRAVENOUS | Status: DC | PRN
  Administered 2019-08-12: 50 mg via INTRAVENOUS
  Administered 2019-08-12: 150 mg via INTRAVENOUS

## 2019-08-12 MED ORDER — EPHEDRINE SULFATE 50 MG/ML IJ SOLN
INTRAMUSCULAR | Status: DC | PRN
  Administered 2019-08-12: 5 mg via INTRAVENOUS
  Administered 2019-08-12 (×4): 10 mg via INTRAVENOUS

## 2019-08-12 MED ORDER — KETOROLAC TROMETHAMINE 30 MG/ML IJ SOLN
INTRAMUSCULAR | Status: AC
  Filled 2019-08-12: qty 1

## 2019-08-12 MED ORDER — THROMBIN 5000 UNITS EX SOLR
CUTANEOUS | Status: DC | PRN
  Administered 2019-08-12: 5000 [IU] via TOPICAL

## 2019-08-12 MED ORDER — OXYCODONE HCL 5 MG PO TABS
5.0000 mg | ORAL_TABLET | Freq: Once | ORAL | Status: AC | PRN
  Administered 2019-08-12: 11:00:00 5 mg via ORAL

## 2019-08-12 MED ORDER — FENTANYL CITRATE (PF) 100 MCG/2ML IJ SOLN
25.0000 ug | INTRAMUSCULAR | Status: DC | PRN
  Administered 2019-08-12 (×2): 25 ug via INTRAVENOUS

## 2019-08-12 MED ORDER — LACTATED RINGERS IV SOLN: INTRAVENOUS | Status: DC

## 2019-08-12 MED ORDER — SUCCINYLCHOLINE CHLORIDE 20 MG/ML IJ SOLN
INTRAMUSCULAR | Status: DC | PRN
  Administered 2019-08-12: 100 mg via INTRAVENOUS

## 2019-08-12 MED ORDER — EPHEDRINE 5 MG/ML INJ
INTRAVENOUS | Status: AC
  Filled 2019-08-12: qty 10

## 2019-08-12 MED ORDER — FENTANYL CITRATE (PF) 100 MCG/2ML IJ SOLN
INTRAMUSCULAR | Status: AC
  Filled 2019-08-12: qty 2

## 2019-08-12 MED ORDER — FENTANYL CITRATE (PF) 100 MCG/2ML IJ SOLN
INTRAMUSCULAR | Status: AC
  Administered 2019-08-12: 50 ug via INTRAVENOUS
  Filled 2019-08-12: qty 2

## 2019-08-12 MED ORDER — DEXAMETHASONE SODIUM PHOSPHATE 10 MG/ML IJ SOLN
INTRAMUSCULAR | Status: DC | PRN
  Administered 2019-08-12: 10 mg via INTRAVENOUS

## 2019-08-12 MED ORDER — DEXAMETHASONE SODIUM PHOSPHATE 10 MG/ML IJ SOLN
INTRAMUSCULAR | Status: AC
  Filled 2019-08-12: qty 1

## 2019-08-12 MED ORDER — KETAMINE HCL 10 MG/ML IJ SOLN
INTRAMUSCULAR | Status: DC | PRN
  Administered 2019-08-12: 30 mg via INTRAVENOUS

## 2019-08-12 MED ORDER — PROPOFOL 500 MG/50ML IV EMUL
INTRAVENOUS | Status: AC
  Filled 2019-08-12: qty 50

## 2019-08-12 MED ORDER — PROPOFOL 10 MG/ML IV BOLUS
INTRAVENOUS | Status: AC
  Filled 2019-08-12: qty 40

## 2019-08-12 MED ORDER — ACETAMINOPHEN 10 MG/ML IV SOLN
INTRAVENOUS | Status: DC | PRN
  Administered 2019-08-12: 1000 mg via INTRAVENOUS

## 2019-08-12 MED ORDER — MIDAZOLAM HCL 2 MG/2ML IJ SOLN
INTRAMUSCULAR | Status: AC
  Filled 2019-08-12: qty 2

## 2019-08-12 MED ORDER — SODIUM CHLORIDE 0.9 % IR SOLN
Status: DC | PRN
  Administered 2019-08-12: 1000 mL

## 2019-08-12 MED ORDER — LIDOCAINE HCL URETHRAL/MUCOSAL 2 % EX GEL
CUTANEOUS | Status: AC
  Filled 2019-08-12: qty 5

## 2019-08-12 MED ORDER — FENTANYL CITRATE (PF) 100 MCG/2ML IJ SOLN
INTRAMUSCULAR | Status: DC | PRN
  Administered 2019-08-12: 25 ug via INTRAVENOUS
  Administered 2019-08-12: 50 ug via INTRAVENOUS
  Administered 2019-08-12: 25 ug via INTRAVENOUS

## 2019-08-12 MED ORDER — ONDANSETRON HCL 4 MG/2ML IJ SOLN
INTRAMUSCULAR | Status: AC
  Filled 2019-08-12: qty 2

## 2019-08-12 MED ORDER — BUPIVACAINE-EPINEPHRINE (PF) 0.5% -1:200000 IJ SOLN
INTRAMUSCULAR | Status: DC | PRN
  Administered 2019-08-12: 7 mL

## 2019-08-12 MED ORDER — PROMETHAZINE HCL 25 MG/ML IJ SOLN: 6.2500 mg | INTRAMUSCULAR | Status: DC | PRN

## 2019-08-12 MED ORDER — PHENYLEPHRINE HCL (PRESSORS) 10 MG/ML IV SOLN
INTRAVENOUS | Status: AC
  Filled 2019-08-12: qty 1

## 2019-08-12 MED ORDER — CEFAZOLIN SODIUM-DEXTROSE 2-4 GM/100ML-% IV SOLN
INTRAVENOUS | Status: AC
  Filled 2019-08-12: qty 100

## 2019-08-12 MED ORDER — SODIUM CHLORIDE 0.9 % IV SOLN: INTRAVENOUS | Status: DC | PRN

## 2019-08-12 MED ORDER — MEPERIDINE HCL 50 MG/ML IJ SOLN: 6.2500 mg | INTRAMUSCULAR | Status: DC | PRN

## 2019-08-12 MED ORDER — LACTATED RINGERS IV SOLN: INTRAVENOUS | Status: DC | PRN

## 2019-08-12 MED ORDER — METHOCARBAMOL 500 MG PO TABS: 500.0000 mg | ORAL_TABLET | Freq: Four times a day (QID) | ORAL | Status: DC | PRN

## 2019-08-12 MED ORDER — GLYCOPYRROLATE 0.2 MG/ML IJ SOLN
INTRAMUSCULAR | Status: DC | PRN
  Administered 2019-08-12: .1 mg via INTRAVENOUS

## 2019-08-12 MED ORDER — OXYCODONE HCL 5 MG/5ML PO SOLN: 5.0000 mg | Freq: Once | ORAL | Status: AC | PRN

## 2019-08-12 MED ORDER — GLYCOPYRROLATE 0.2 MG/ML IJ SOLN
INTRAMUSCULAR | Status: AC
  Filled 2019-08-12: qty 1

## 2019-08-12 MED ORDER — ACETAMINOPHEN 10 MG/ML IV SOLN
INTRAVENOUS | Status: AC
  Filled 2019-08-12: qty 100

## 2019-08-12 MED ORDER — METHOCARBAMOL 500 MG PO TABS: 500.0000 mg | ORAL_TABLET | Freq: Four times a day (QID) | ORAL | 0 refills | Status: DC | PRN

## 2019-08-12 MED ORDER — SODIUM CHLORIDE 0.9 % IV SOLN
INTRAVENOUS | Status: DC | PRN
  Administered 2019-08-12: 09:00:00 25 ug/min via INTRAVENOUS

## 2019-08-12 MED ORDER — KETAMINE HCL 50 MG/ML IJ SOLN
INTRAMUSCULAR | Status: AC
  Filled 2019-08-12: qty 10

## 2019-08-12 MED ORDER — OXYCODONE HCL 5 MG PO TABS: 5.0000 mg | ORAL_TABLET | ORAL | 0 refills | Status: AC | PRN

## 2019-08-12 MED ORDER — STERILE WATER FOR INJECTION IJ SOLN
INTRAMUSCULAR | Status: AC
  Filled 2019-08-12: qty 10

## 2019-08-12 MED ORDER — LIDOCAINE HCL (CARDIAC) PF 100 MG/5ML IV SOSY
PREFILLED_SYRINGE | INTRAVENOUS | Status: DC | PRN
  Administered 2019-08-12: 100 mg via INTRAVENOUS

## 2019-08-12 MED ORDER — OXYCODONE HCL 5 MG PO TABS
ORAL_TABLET | ORAL | Status: AC
  Filled 2019-08-12: qty 1

## 2019-08-12 SURGICAL SUPPLY — 59 items
BASKET BONE COLLECTION (BASKET) IMPLANT
BLADE BOVIE TIP EXT 4 (BLADE) IMPLANT
BULB RESERV EVAC DRAIN JP 100C (MISCELLANEOUS) IMPLANT
BUR NEURO DRILL SOFT 3.0X3.8M (BURR) ×2 IMPLANT
CANISTER SUCT 1200ML W/VALVE (MISCELLANEOUS) ×4 IMPLANT
CHLORAPREP W/TINT 26 (MISCELLANEOUS) ×2 IMPLANT
COUNTER NEEDLE 20/40 LG (NEEDLE) ×2 IMPLANT
COVER LIGHT HANDLE STERIS (MISCELLANEOUS) ×4 IMPLANT
COVER WAND RF STERILE (DRAPES) ×2 IMPLANT
CRADLE LAMINECT ARM (MISCELLANEOUS) ×2 IMPLANT
CUP MEDICINE 2OZ PLAST GRAD ST (MISCELLANEOUS) ×2 IMPLANT
DERMABOND ADVANCED (GAUZE/BANDAGES/DRESSINGS) ×1
DERMABOND ADVANCED .7 DNX12 (GAUZE/BANDAGES/DRESSINGS) ×1 IMPLANT
DRAIN CHANNEL JP 10F RND 20C F (MISCELLANEOUS) IMPLANT
DRAPE C-ARM 42X72 X-RAY (DRAPES) ×4 IMPLANT
DRAPE LAPAROTOMY 77X122 PED (DRAPES) ×2 IMPLANT
DRAPE MICROSCOPE SPINE 48X150 (DRAPES) ×2 IMPLANT
DRAPE POUCH INSTRU U-SHP 10X18 (DRAPES) IMPLANT
DRAPE SURG 17X11 SM STRL (DRAPES) ×8 IMPLANT
ELECT CAUTERY BLADE TIP 2.5 (TIP) ×2
ELECT REM PT RETURN 9FT ADLT (ELECTROSURGICAL) ×2
ELECTRODE CAUTERY BLDE TIP 2.5 (TIP) ×1 IMPLANT
ELECTRODE REM PT RTRN 9FT ADLT (ELECTROSURGICAL) ×1 IMPLANT
FEE INTRAOP MONITOR IMPULS NCS (MISCELLANEOUS) IMPLANT
FRAME EYE SHIELD (PROTECTIVE WEAR) IMPLANT
GLOVE BIOGEL PI IND STRL 7.0 (GLOVE) ×1 IMPLANT
GLOVE BIOGEL PI INDICATOR 7.0 (GLOVE) ×1
GLOVE SURG SYN 7.0 (GLOVE) ×4 IMPLANT
GLOVE SURG SYN 8.5  E (GLOVE) ×3
GLOVE SURG SYN 8.5 E (GLOVE) ×3 IMPLANT
GOWN SRG XL LVL 3 NONREINFORCE (GOWNS) ×1 IMPLANT
GOWN STRL NON-REIN TWL XL LVL3 (GOWNS) ×1
GOWN STRL REUS W/TWL MED LVL3 (GOWN DISPOSABLE) ×2 IMPLANT
GRADUATE 1200CC STRL 31836 (MISCELLANEOUS) ×2 IMPLANT
INTRAOP MONITOR FEE IMPULS NCS (MISCELLANEOUS)
INTRAOP MONITOR FEE IMPULSE (MISCELLANEOUS)
KIT TURNOVER KIT A (KITS) ×2 IMPLANT
MARKER SKIN DUAL TIP RULER LAB (MISCELLANEOUS) ×4 IMPLANT
NDL SAFETY ECLIPSE 18X1.5 (NEEDLE) ×1 IMPLANT
NEEDLE HYPO 18GX1.5 SHARP (NEEDLE) ×1
NEEDLE HYPO 22GX1.5 SAFETY (NEEDLE) ×2 IMPLANT
NS IRRIG 1000ML POUR BTL (IV SOLUTION) ×2 IMPLANT
PACK LAMINECTOMY NEURO (CUSTOM PROCEDURE TRAY) ×2 IMPLANT
PIN CASPAR 14 (PIN) ×1 IMPLANT
PIN CASPAR 14MM (PIN) ×2
PLATE ANT CERV XTEND 1 LV 12 (Plate) ×2 IMPLANT
SCREW VAR 4.2 XD SELF DRILL 16 (Screw) ×8 IMPLANT
SPACER CERV FRGE 12X14X9-7 (Spacer) ×2 IMPLANT
SPOGE SURGIFLO 8M (HEMOSTASIS) ×1
SPONGE KITTNER 5P (MISCELLANEOUS) ×2 IMPLANT
SPONGE SURGIFLO 8M (HEMOSTASIS) ×1 IMPLANT
STAPLER SKIN PROX 35W (STAPLE) IMPLANT
SUT V-LOC 90 ABS DVC 3-0 CL (SUTURE) ×2 IMPLANT
SUT VIC AB 3-0 SH 8-18 (SUTURE) ×2 IMPLANT
SYR 30ML LL (SYRINGE) ×2 IMPLANT
TAPE CLOTH 3X10 WHT NS LF (GAUZE/BANDAGES/DRESSINGS) ×2 IMPLANT
TOWEL OR 17X26 4PK STRL BLUE (TOWEL DISPOSABLE) ×4 IMPLANT
TRAY FOLEY MTR SLVR 16FR STAT (SET/KITS/TRAYS/PACK) IMPLANT
TUBING CONNECTING 10 (TUBING) ×2 IMPLANT

## 2019-08-12 NOTE — OR Nursing (Incomplete)
To x-ray via w/c by NT 12:11 pm. ?

## 2019-08-12 NOTE — Anesthesia Preprocedure Evaluation (Signed)
Anesthesia Evaluation  Patient identified by MRN, date of birth, ID band Patient awake    Reviewed: Allergy & Precautions, NPO status , Patient's Chart, lab work & pertinent test results  History of Anesthesia Complications Negative for: history of anesthetic complications  Airway Mallampati: III  TM Distance: >3 FB Neck ROM: Full    Dental no notable dental hx.    Pulmonary neg pulmonary ROS, neg sleep apnea, neg COPD,    breath sounds clear to auscultation- rhonchi (-) wheezing      Cardiovascular Exercise Tolerance: Good (-) hypertension(-) CAD, (-) Past MI, (-) Cardiac Stents and (-) CABG  Rhythm:Regular Rate:Normal - Systolic murmurs and - Diastolic murmurs    Neuro/Psych neg Seizures PSYCHIATRIC DISORDERS Anxiety Depression negative neurological ROS     GI/Hepatic Neg liver ROS, hiatal hernia, GERD  ,  Endo/Other  negative endocrine ROSneg diabetes  Renal/GU negative Renal ROS     Musculoskeletal negative musculoskeletal ROS (+)   Abdominal (+) - obese,   Peds  Hematology negative hematology ROS (+)   Anesthesia Other Findings Past Medical History: No date: Barrett's esophagus No date: Depression No date: Hiatal hernia No date: History of kidney stones 04/24/2018: Nausea     Comment:  Nausea most of the time for the last month. Vomiting               on/off.   Reproductive/Obstetrics                             Anesthesia Physical Anesthesia Plan  ASA: II  Anesthesia Plan: General   Post-op Pain Management:    Induction: Intravenous  PONV Risk Score and Plan: 1 and Ondansetron, Dexamethasone and Midazolam  Airway Management Planned: Oral ETT  Additional Equipment:   Intra-op Plan:   Post-operative Plan: Extubation in OR  Informed Consent: I have reviewed the patients History and Physical, chart, labs and discussed the procedure including the risks, benefits and  alternatives for the proposed anesthesia with the patient or authorized representative who has indicated his/her understanding and acceptance.     Dental advisory given  Plan Discussed with: CRNA and Anesthesiologist  Anesthesia Plan Comments:         Anesthesia Quick Evaluation

## 2019-08-12 NOTE — Anesthesia Procedure Notes (Signed)
Procedure Name: Intubation Date/Time: 08/12/2019 8:24 AM Performed by: Omer Jack, CRNA Pre-anesthesia Checklist: Patient identified, Patient being monitored, Timeout performed, Emergency Drugs available and Suction available Patient Re-evaluated:Patient Re-evaluated prior to induction Oxygen Delivery Method: Circle system utilized Preoxygenation: Pre-oxygenation with 100% oxygen Induction Type: IV induction Ventilation: Mask ventilation without difficulty Laryngoscope Size: McGraph and 4 Grade View: Grade I Tube type: Oral Tube size: 7.5 mm Number of attempts: 1 Airway Equipment and Method: Stylet Placement Confirmation: ETT inserted through vocal cords under direct vision,  positive ETCO2 and breath sounds checked- equal and bilateral Secured at: 21 cm Tube secured with: Tape Dental Injury: Teeth and Oropharynx as per pre-operative assessment  Comments: Head remained in neutral position for intubation

## 2019-08-12 NOTE — OR Nursing (Signed)
Back from xray 12:25 pm, to bathroom for void, advises pain "much better", rates 2/10.  Will need to stay until 3pm per MD, patient/family aware of same.

## 2019-08-12 NOTE — Discharge Summary (Signed)
Procedure: C6-7 ACDF Procedure date: 08/12/2019 Diagnosis: cervical radiculoapthy   History: Kyle Gallegos is s/p C6-7 ACDF for cervical radiculopathy  POD0: Tolerated procedure well. Evaluated in post op recovery still disoriented from anesthesia but able to answer questions and obey commands. Complains of posterior neck pain. Left arm pain has resolved. Denies any new upper or lower extremity pain/numbness/tingling.   Physical Exam: Vitals:   08/12/19 0710  BP: (!) 145/79  Pulse: (!) 50  Resp: 18  Temp: (!) 96 F (35.6 C)  SpO2: 98%   Strength:5/5 throughout upper and lower extremities Sensation: intact and symmetric throughout upper and lower extremities Skin: Glue intact at incision site  Data:  Recent Labs  Lab 08/07/19 0955  NA 139  K 3.9  CL 106  CO2 24  BUN 19  CREATININE 1.30*  GLUCOSE 101*  CALCIUM 9.2   No results for input(s): AST, ALT, ALKPHOS in the last 168 hours.  Invalid input(s): TBILI   Recent Labs  Lab 08/07/19 0955  WBC 6.1  HGB 13.9  HCT 43.3  PLT 216   Recent Labs  Lab 08/07/19 0955  APTT 28  INR 0.9         Other tests/results:  EXAM: CERVICAL SPINE - 2-3 VIEW  08/12/2019  COMPARISON:  Intraoperative imaging of earlier today.   FINDINGS: Anterior plate and screw fixation at C6-7. The operative level is partially obscured by overlying soft tissues on the lateral. No acute hardware complication identified. Maintenance of vertebral body height and alignment across the imaged levels.   IMPRESSION: Expected appearance after anterior fixation at C6-7.  Assessment/Plan:  Kyle Gallegos is POD0 s/p C6-7 ACDF for cervical radiculopathy.  Upper extremity pain that he was experiencing prior to surgery has resolved. Post op pain management with tyelnol, muscle relaxer, and pain medication as needed, He is scheduled to follow up in clinic in approximately 2 weeks to monitor progress.   Kyle Drape PA-C Department of  Neurosurgery

## 2019-08-12 NOTE — Anesthesia Postprocedure Evaluation (Signed)
Anesthesia Post Note  Patient: MARTELL MCFADYEN  Procedure(s) Performed: ANTERIOR CERVICAL DECOMPRESSION/DISCECTOMY FUSION 1 LEVEL C6-7 (N/A )  Patient location during evaluation: PACU Anesthesia Type: General Level of consciousness: awake and alert and oriented Pain management: pain level controlled Vital Signs Assessment: post-procedure vital signs reviewed and stable Respiratory status: spontaneous breathing, nonlabored ventilation and respiratory function stable Cardiovascular status: blood pressure returned to baseline and stable Postop Assessment: no signs of nausea or vomiting Anesthetic complications: no     Last Vitals:  Vitals:   08/12/19 1101 08/12/19 1106  BP: (!) 144/78 (!) 155/78  Pulse: 77 65  Resp: 15 16  Temp: (!) 36.2 C 36.5 C  SpO2: 93% 93%    Last Pain:  Vitals:   08/12/19 1106  TempSrc: Temporal  PainSc: 4                  Khalise Billard

## 2019-08-12 NOTE — OR Nursing (Signed)
Clarification of discharge guidelines, may discharge 4 hours after arriving in pacu and not arrival time in postop (per PACU RN).  Will discharge 2pm.

## 2019-08-12 NOTE — Transfer of Care (Signed)
Immediate Anesthesia Transfer of Care Note  Patient: Kyle Gallegos  Procedure(s) Performed: ANTERIOR CERVICAL DECOMPRESSION/DISCECTOMY FUSION 1 LEVEL C6-7 (N/A )  Patient Location: PACU  Anesthesia Type:General  Level of Consciousness: drowsy and cooperative  Airway & Oxygen Therapy: Patient Spontanous Breathing and Patient connected to face mask oxygen  Post-op Assessment: Report given to RN and Post -op Vital signs reviewed and stable  Post vital signs: Reviewed and stable  Last Vitals:  Vitals Value Taken Time  BP 150/74 08/12/19 1000  Temp    Pulse 78 08/12/19 1000  Resp 18 08/12/19 1000  SpO2 99 % 08/12/19 1000  Vitals shown include unvalidated device data.  Last Pain:  Vitals:   08/12/19 0710  TempSrc: Tympanic  PainSc: 3          Complications: No apparent anesthesia complications

## 2019-08-12 NOTE — Op Note (Signed)
Indications: Mr. Frediani is a 54 yo male who presented with cervical radiculopathy.  He had weakness which prompted surgical intervention.   Findings: disc herniation at C6-7  Preoperative Diagnosis: Cervical radiculopathy Postoperative Diagnosis: same   EBL: 25 ml IVF: 500 ml Drains: none Disposition: Extubated and Stable to PACU Complications: none  No foley catheter was placed.   Preoperative Note:   Risks of surgery discussed include: infection, bleeding, stroke, coma, death, paralysis, CSF leak, nerve/spinal cord injury, numbness, tingling, weakness, complex regional pain syndrome, recurrent stenosis and/or disc herniation, vascular injury, development of instability, neck/back pain, need for further surgery, persistent symptoms, development of deformity, and the risks of anesthesia. The patient understood these risks and agreed to proceed.  Procedure:  1) Anterior cervical diskectomy and fusion at C6-7 2) Anterior cervical instrumentation at C6-7 using Globus Xtend 3) Structural allograft consisting of corticocancellous allograft   Procedure: After obtaining informed consent, the patient taken to the operating room, placed in supine position, general anesthesia induced.  The patient had a small shoulder roll placed behind their shoulders.  The patient received preop antibiotics and IV Decadron.  The patient had a neck incision outlined, was prepped and draped in usual sterile fashion. The incision was injected with local anesthetic.   An incision was opened, dissection taken down medial to the carotid artery and jugular vein, lateral to the trachea and esophagus.  The prevertebral fascia identified and a localizing x-ray demonstrated the correct level.  The longus colli were dissected laterally, and self-retaining retractors placed to open the operative field. The microscope was then brought into the field.  With this complete, distractor pins were placed in the vertebral bodies of  C6 and C7. The distractor was placed, and the anulus at C6/7 was opened using a bovie.  Curettes and pituitary rongeurs used to remove the majority of disk, then the drill was used to remove the posterior osteophyte and begin the foraminotomies. The nerve hook was used to elevate the posterior longitudinal ligament, which was then removed with Kerrison rongeurs. The microblunt nerve hook could be passed out the foramen bilaterally.   Meticulous hemostasis obtained.  Structural allograft was tapped behind the anterior lip of the vertebral body at C6/7 (9 mm).    The caspar distractor was removed, and bone wax used for hemostasis. A separate, 12 mm Globus Xtend plate was chosen.  Two screws placed in each vertebral body, respectively making sure the screws were behind the locking mechanism.  Final AP and lateral radiographs were taken.   With everything in good position, the wound was irrigated copiously with bacitracin-containing solution and meticulous hemostasis obtained.  Wound was closed in 2 layers using interrupted inverted 3-0 Vicryl sutures.  The wound was dressed with dermabond, the head of bed at 30 degrees, taken to recovery room in stable condition.  No new postop neurological deficits were identified.  Sponge and pattie counts were correct at the end of the procedure.    I performed the entire procedure with Ivar Drape PA as an Designer, television/film set.  Venetia Night MD

## 2019-08-12 NOTE — Discharge Instructions (Signed)
AMBULATORY SURGERY  DISCHARGE INSTRUCTIONS   1) The drugs that you were given will stay in your system until tomorrow so for the next 24 hours you should not:  A) Drive an automobile B) Make any legal decisions C) Drink any alcoholic beverage   2) You may resume regular meals tomorrow.  Today it is better to start with liquids and gradually work up to solid foods.  You may eat anything you prefer, but it is better to start with liquids, then soup and crackers, and gradually work up to solid foods.   3) Please notify your doctor immediately if you have any unusual bleeding, trouble breathing, redness and pain at the surgery site, drainage, fever, or pain not relieved by medication. 4)   5) Your post-operative visit with Dr.                                     is: Date:                        Time:    Please call to schedule your post-operative visit.  6) Additional Instructions:     Your surgeon has performed an operation on your cervical spine (neck) to relieve pressure on the spinal cord and/or nerves. This involved making an incision in the front of your neck and removing one or more of the discs that support your spine. Next, a small piece of bone, a titanium plate, and screws were used to fuse two or more of the vertebrae (bones) together.  The following are instructions to help in your recovery once you have been discharged from the hospital. Even if you feel well, it is important that you follow these activity guidelines. If you do not let your neck heal properly from the surgery, you can increase the chance of return of your symptoms and other complications.  * Do not take anti-inflammatory medications for 3 months after surgery (naproxen [Aleve], ibuprofen [Advil, Motrin], etc.). These medications can prevent your bones from healing properly.  Activity    No bending, lifting, or twisting ("BLT"). Avoid lifting objects heavier than 10 pounds (gallon milk jug).  Where  possible, avoid household activities that involve lifting, bending, reaching, pushing, or pulling such as laundry, vacuuming, grocery shopping, and childcare. Try to arrange for help from friends and family for these activities while your back heals.  Increase physical activity slowly as tolerated.  Taking short walks is encouraged, but avoid strenuous exercise. Do not jog, run, bicycle, lift weights, or participate in any other exercises unless specifically allowed by your doctor.  Talk to your doctor before resuming sexual activity.  You should not drive until cleared by your doctor.  Until released by your doctor, you should not return to work or school.  You should rest at home and let your body heal.   You may shower three days after your surgery.  After showering, lightly dab your incision dry. Do not take a tub bath or go swimming until approved by your doctor at your follow-up appointment.  If your doctor ordered a cervical collar (neck brace) for you, you should wear it whenever you are out of bed. You may remove it when lying down or sleeping, but you should wear it at all other times. Not all neck surgeries require a cervical collar.  If you smoke, we strongly recommend that you quit.  Smoking has been proven to interfere with normal bone healing and will dramatically reduce the success rate of your surgery. Please contact [facility] (800-QUIT-NOW) and use the resources at www.[facility].com for assistance in stopping smoking.  Surgical Incision   If you have a dressing on your incision, you may remove it two days after your surgery. Keep your incision area clean and dry.  If you have staples or stitches on your incision, you should have a follow up scheduled for removal. If you do not have staples or stitches, you will have steri-strips (small pieces of surgical tape) or Dermabond glue. The steri-strips/glue should begin to peel away within about a week (it is fine if the steri-strips  fall off before then). If the strips are still in place one week after your surgery, you may gently remove them.  Diet           You may return to your usual diet. However, you may experience discomfort when swallowing in the first month after your surgery. This is normal. You may find that softer foods are more comfortable for you to swallow. Be sure to stay hydrated.  When to Contact us  You may experience pain in your neck and/or pain between your shoulder blades. This is normal and should improve in the next few weeks with the help of pain medication, muscle relaxers, and rest. Some patients report that a warm compress on the back of the neck or between the shoulder blades helps.  However, should you experience any of the following, contact us immediately: . New numbness or weakness . Pain that is progressively getting worse, and is not relieved by your pain medication, muscle relaxers, rest, and warm compresses . Bleeding, redness, swelling, pain, or drainage from surgical incision . Chills or flu-like symptoms . Fever greater than 101.0 F (38.3 C) . Inability to eat, drink fluids, or take medications . Problems with bowel or bladder functions . Difficulty breathing or shortness of breath . Warmth, tenderness, or swelling in your calf Contact Information . During office hours (Monday-Friday 9 am to 5 pm), please call your physician at [phone] and ask for Berdine Addison . After hours and weekends, please call [phone] and an answering service will put you in touch with either Dr. Lacinda Axon or Dr. Izora Ribas.  . For a life-threatening emergency, call 911

## 2019-08-12 NOTE — H&P (Signed)
I have reviewed and confirmed my history and physical from 07/30/2019 with no additions or changes. Plan for C6-7 ACDF.  Risks and benefits reviewed.  Heart sounds normal no MRG. Chest Clear to Auscultation Bilaterally.

## 2019-08-13 ENCOUNTER — Other Ambulatory Visit
Admission: RE | Admit: 2019-08-13 | Discharge: 2019-08-13 | Disposition: A | Discharge status: Home / Self Care | Payer: No Typology Code available for payment source | Source: Ambulatory Visit | Attending: Student | Admitting: Student

## 2019-08-13 DIAGNOSIS — Z981 Arthrodesis status: Secondary | ICD-10-CM | POA: Insufficient documentation

## 2019-08-13 DIAGNOSIS — R079 Chest pain, unspecified: Secondary | ICD-10-CM | POA: Diagnosis present

## 2019-08-13 LAB — TROPONIN I (HIGH SENSITIVITY): Troponin I (High Sensitivity): 3 ng/L (ref <18)

## 2019-08-13 NOTE — Progress Notes (Signed)
Patients wife states patient has had some pain in his chest without SOB. Instructed patient's wife to notify MD immediately or come to ER.  She verbalized understanding.

## 2020-03-23 ENCOUNTER — Other Ambulatory Visit: Payer: Self-pay | Admitting: Student

## 2020-03-23 DIAGNOSIS — S46011A Strain of muscle(s) and tendon(s) of the rotator cuff of right shoulder, initial encounter: Secondary | ICD-10-CM

## 2020-03-23 DIAGNOSIS — M25511 Pain in right shoulder: Secondary | ICD-10-CM

## 2020-03-25 ENCOUNTER — Ambulatory Visit
Admission: RE | Admit: 2020-03-25 | Discharge: 2020-03-25 | Disposition: A | Discharge status: Home / Self Care | Payer: No Typology Code available for payment source | Source: Ambulatory Visit | Attending: Student | Admitting: Student

## 2020-03-25 ENCOUNTER — Other Ambulatory Visit: Payer: Self-pay

## 2020-03-25 DIAGNOSIS — M25511 Pain in right shoulder: Secondary | ICD-10-CM | POA: Insufficient documentation

## 2020-03-25 DIAGNOSIS — S46011A Strain of muscle(s) and tendon(s) of the rotator cuff of right shoulder, initial encounter: Secondary | ICD-10-CM | POA: Diagnosis present

## 2020-03-25 IMAGING — MR MR SHOULDER*R* W/O CM
5 series · 40 of 40 positions shown · non-contrast
Comparison: None.

CLINICAL DATA: Chronic right arm pain since weight lifting injury
in early [[place]]. No prior surgery.

EXAM:
MRI OF THE RIGHT SHOULDER WITHOUT CONTRAST
TECHNIQUE: Multiplanar, multisequence MR imaging of the shoulder was performed.
No intravenous contrast was administered.

[Series 3: T2 fat-sat · axial · 4.0mm · 0.59mm/px · z∈[-10,+92]mm · 8 of 25 slices shown (1 of 3)]
[im 1/25]
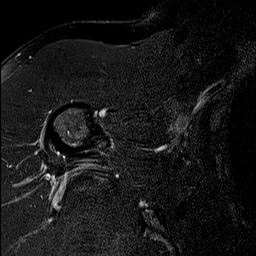
[im 4/25]
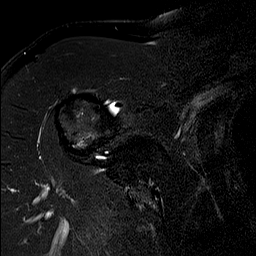
[im 7/25]
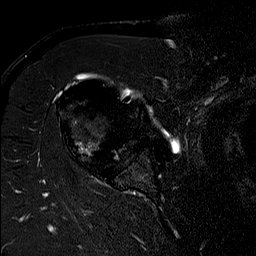
[im 11/25]
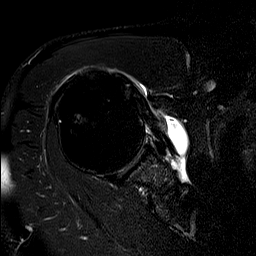
[im 14/25]
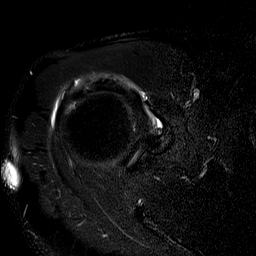
[im 18/25]
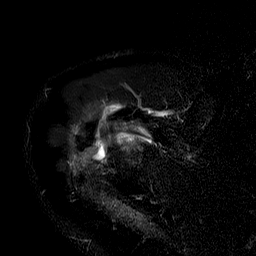
[im 21/25]
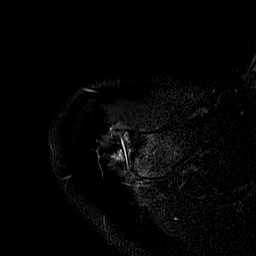
[im 25/25]
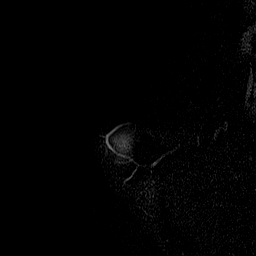

[Series 4: T2 fat-sat · oblique · 4.0mm · 0.59mm/px · 8 of 23 slices shown (2 of 3)]
[im 1/23]
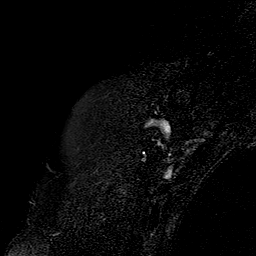
[im 4/23]
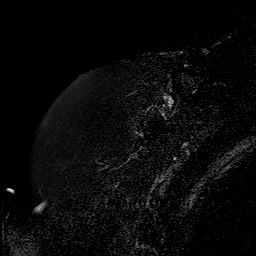
[im 7/23]
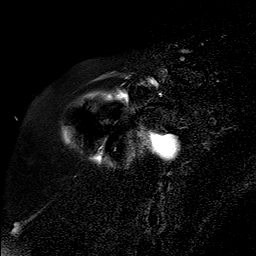
[im 10/23]
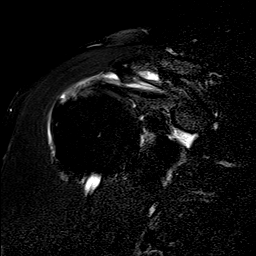
[im 13/23]
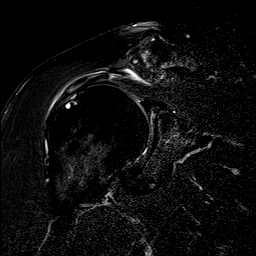
[im 16/23]
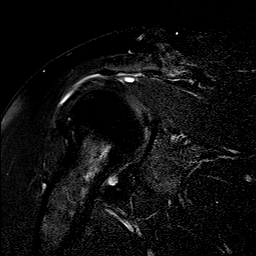
[im 19/23]
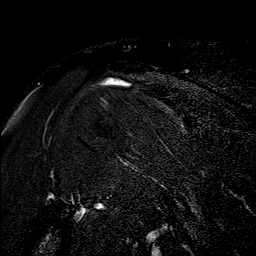
[im 23/23]
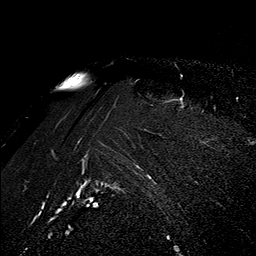

[Series 5: PD · oblique · 4.0mm · 0.59mm/px · 8 of 23 slices shown]
[im 1/23]
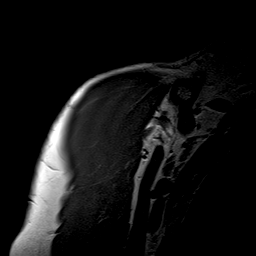
[im 4/23]
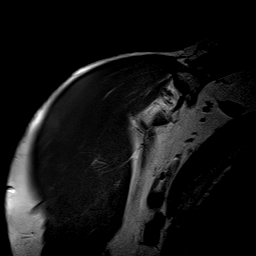
[im 7/23]
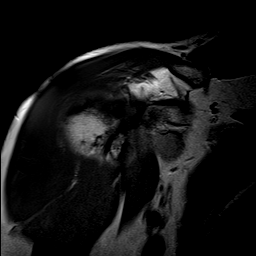
[im 10/23]
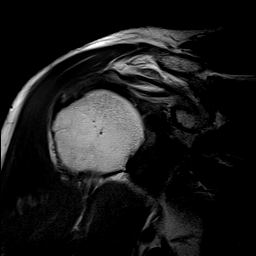
[im 13/23]
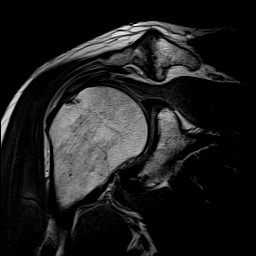
[im 16/23]
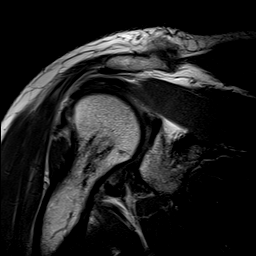
[im 19/23]
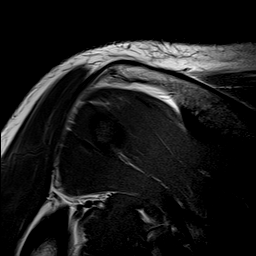
[im 23/23]
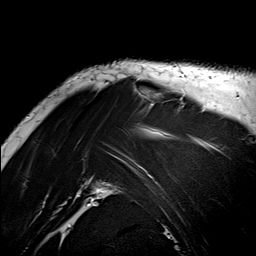

[Series 6: T1 · oblique · 4.0mm · 0.59mm/px · 8 of 23 slices shown]
[im 1/23]
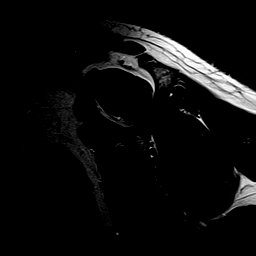
[im 4/23]
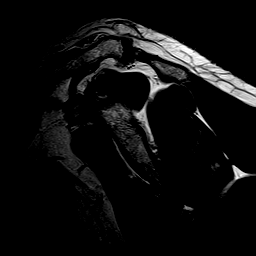
[im 7/23]
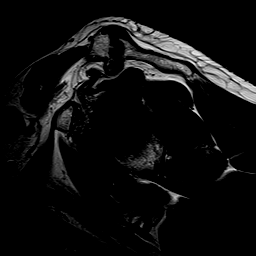
[im 10/23]
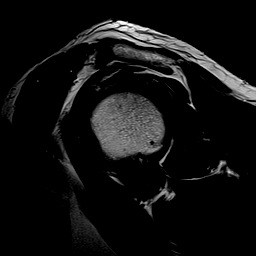
[im 13/23]
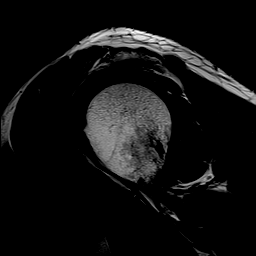
[im 16/23]
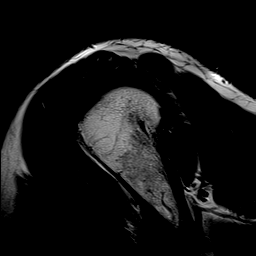
[im 19/23]
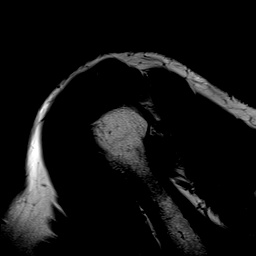
[im 23/23]
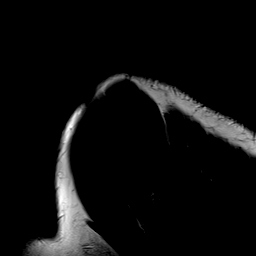

[Series 7: T2 fat-sat · oblique · 4.0mm · 0.59mm/px · 8 of 23 slices shown (3 of 3)]
[im 1/23]
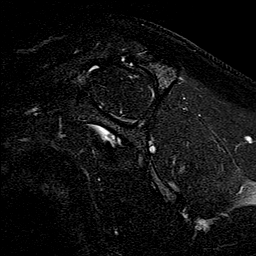
[im 4/23]
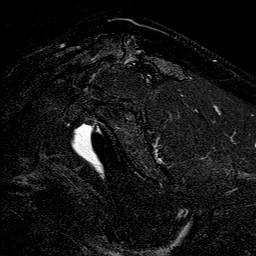
[im 7/23]
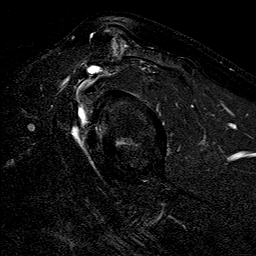
[im 10/23]
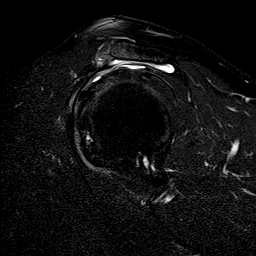
[im 13/23]
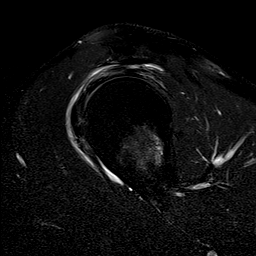
[im 16/23]
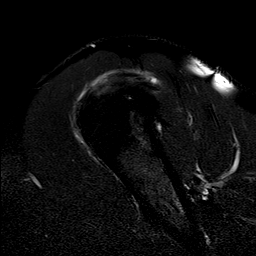
[im 19/23]
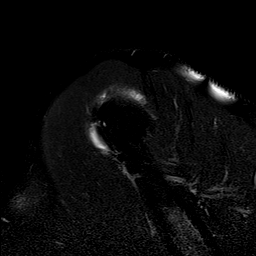
[im 23/23]
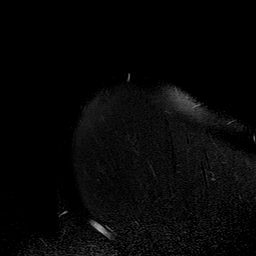

[40 of 40 positions shown; findings below may reference images not displayed]

FINDINGS: Rotator cuff: Severe supraspinatus tendinosis with partial-thickness
bursal surface tear of the distal tendon. Moderate infraspinatus
tendinosis. Low-grade partial-thickness articular surface tear of
the distal subscapularis tendon. The teres minor tendon is intact.

Muscles: No atrophy or abnormal signal of the muscles of the rotator
cuff.

Biceps long head:  Intact and normally positioned.

Acromioclavicular Joint: Severe arthropathy of the acromioclavicular
joint. Type I acromion. Small amount of fluid in the
subacromial/subdeltoid bursa.

Glenohumeral Joint: Small joint effusion.  No chondral defect.

Labrum:  Superior labral tear.  Remaining labrum is grossly intact.

Bones: No acute fracture or dislocation. No suspicious bone lesion.

Other: None.
IMPRESSION: 1. Severe supraspinatus tendinosis with partial-thickness bursal
surface tear of the distal tendon.
2. Low-grade partial-thickness articular surface tear of the distal
subscapularis tendon.
3. Moderate infraspinatus tendinosis.
4. Superior labral tear.
5. Severe acromioclavicular osteoarthritis.
6. Mild subacromial/subdeltoid bursitis.

## 2020-04-21 ENCOUNTER — Other Ambulatory Visit: Payer: Self-pay | Admitting: Surgery

## 2020-04-29 ENCOUNTER — Other Ambulatory Visit
Admission: RE | Admit: 2020-04-29 | Discharge: 2020-04-29 | Disposition: A | Discharge status: Home / Self Care | Payer: No Typology Code available for payment source | Source: Ambulatory Visit | Attending: Surgery | Admitting: Surgery

## 2020-04-29 ENCOUNTER — Other Ambulatory Visit: Payer: Self-pay

## 2020-04-29 HISTORY — DX: Unspecified osteoarthritis, unspecified site: M19.90

## 2020-04-29 HISTORY — DX: Gastro-esophageal reflux disease without esophagitis: K21.9

## 2020-04-29 HISTORY — DX: Chronic kidney disease, unspecified: N18.9

## 2020-04-29 HISTORY — DX: Anxiety disorder, unspecified: F41.9

## 2020-04-29 NOTE — Patient Instructions (Signed)
INSTRUCTIONS FOR SURGERY     Your surgery is scheduled for:   Tuesday, January 18TH     To find out your arrival time for the day of surgery,          please call [phone] between 1 pm and 3 pm on : Monday, January 17TH     When you arrive for surgery, report to THE [facility].  Once you are registered, go to the     Second floor and sign in at the surgery desk.    REMEMBER: Instructions that are not followed completely may result in serious medical risk,  up to and including death, or upon the discretion of your surgeon and anesthesiologist,            your surgery may need to be rescheduled.  __X__ 1. Do not eat food after midnight the night before your procedure.                    No gum, candy, lozenger, tic tacs, tums or hard candies.                  ABSOLUTELY NOTHING SOLID IN YOUR MOUTH AFTER MIDNIGHT                    You may drink unlimited clear liquids up to 2 hours before you are scheduled to arrive for surgery.                   Do not drink anything within those 2 hours unless you need to take medicine, then take the                   smallest amount you need.  Clear liquids include:  water, apple juice without pulp,                   any flavor Gatorade, Black coffee, black tea.  Sugar may be added but no dairy/ honey /lemon.                        Broth and jello is not considered a clear liquid.  __x__  2. On the morning of surgery, please brush your teeth with toothpaste and water. You may rinse with                  mouthwash if you wish but DO NOT SWALLOW TOOTHPASTE OR MOUTHWASH  __X___3. NO alcohol for 24 hours before or after surgery.  __x___ 4.  Do NOT smoke or use e-cigarettes for 24 HOURS PRIOR TO SURGERY.                      DO NOT Use any chewable tobacco products for at least 6 hours prior to surgery.  __x___ 5. If you start any new medication after this appointment and prior to surgery,  please                   Bring it with you on the day of surgery.  ___x__ 6. Notify your doctor if there is any change  in your medical condition, such as fever,                   infection, vomitting, diarrhea or any open sores.  __x___ 7.  USE the CHG SOAP as instructed, the night before surgery and the day of surgery.                   Once you have washed with this soap, do NOT use any of the following: Powders, perfumes                    or lotions. Please do not wear make up, hairpins, clips or nail polish. You MAY NOT wear deodorant.                   Men may shave their face and neck.  Women need to shave 48 hours prior to surgery.                   DO NOT wear ANY jewelry on the day of surgery. If there are rings that are too tight to                    remove easily, please address this prior to the surgery day. Piercings need to be removed.                                                                     NO METAL ON YOUR BODY.                    Do NOT bring any valuables.  If you came to [facility]mit testing then you will not need license,                     insurance card or credit card.  If you will be staying overnight, please either leave your things in                     the car or have your family be responsible for these items.                     [facility] IS NOT RESPONSIBLE FOR BELONGINGS OR VALUABLES.  ___X__ 8. DO NOT wear contact lenses on surgery day.  You may not have dentures,                     Hearing aides, contacts or glasses in the operating room. These items can be                    Placed in the Recovery Room to receive immediately after surgery.  __x___ 9. IF YOU ARE SCHEDULED TO GO HOME ON THE SAME DAY, YOU MUST                   Have someone to drive you home and to stay with you  for the first 24 hours.                    Have an arrangement prior to arriving on surgery day.  ___x__ 10. Take the following  medications on the morning of surgery with  a sip of water:                              1. [place] (take an extra dose the night before surgery).                     4.  __X___ 11.  Follow any instructions provided to you by your surgeon.                        Such as enema, clear liquid bowel prep                    PLEASE DRINK THE PRESURGICAL CARBOHYDRATE DRINK ON THE                    DAY OF SURGERY. HAVE IT CONSUMED BY 2 HOURS PRIOR TO ARRIVAL.  __X__  12. STOP ALL ASPIRIN PRODUCTS AS OF January 11TH                       THIS INCLUDES BC POWDERS / GOODIES POWDER  __X___ 13. STOP Anti-inflammatories as of January 11TH                      This includes IBUPROFEN / MOTRIN / ADVIL / ALEVE/ NAPROXYN                    YOU MAY TAKE TYLENOL ANY TIME PRIOR TO SURGERY.  __X___ 5.You may continue taking Vitamin D3 but do not take on the morning of surgery.  ___X___18. Wear clean and comfortable clothing to the hospital.  BRING OR WEAR A          SOFT T SHIRT TO GO UNDER YOUR SLING. A LARGE BUTTON DOWN SHIRT IS          ALSO EASY TO GET INTO AFTER SURGERY.  MAKE SURE TO HAVE PHONE NUMBERS FOR YOUR CONTACTS.   BRING THE SLING WITH YOU ON SURGERY DAY.  CONTINUE TAKING LIPITOR IN THE EVENING.

## 2020-05-06 ENCOUNTER — Other Ambulatory Visit: Admission: RE | Admit: 2020-05-06 | Payer: No Typology Code available for payment source | Source: Ambulatory Visit

## 2020-05-10 ENCOUNTER — Encounter: Admission: RE | Payer: Self-pay | Source: Home / Self Care

## 2020-05-10 ENCOUNTER — Ambulatory Visit
Admission: RE | Admit: 2020-05-10 | Payer: No Typology Code available for payment source | Source: Home / Self Care | Admitting: Surgery

## 2020-05-10 SURGERY — SHOULDER ARTHROSCOPY WITH ROTATOR CUFF REPAIR AND OPEN BICEPS TENODESIS
Anesthesia: Choice | Site: Shoulder | Laterality: Right

## 2020-12-13 ENCOUNTER — Encounter: Payer: Self-pay | Admitting: Emergency Medicine

## 2020-12-13 ENCOUNTER — Emergency Department
Admission: EM | Admit: 2020-12-13 | Discharge: 2020-12-13 | Disposition: A | Discharge status: Home / Self Care | Payer: No Typology Code available for payment source | Attending: Emergency Medicine | Admitting: Emergency Medicine

## 2020-12-13 ENCOUNTER — Emergency Department: Payer: No Typology Code available for payment source

## 2020-12-13 ENCOUNTER — Other Ambulatory Visit: Payer: Self-pay

## 2020-12-13 DIAGNOSIS — N183 Chronic kidney disease, stage 3 unspecified: Secondary | ICD-10-CM | POA: Diagnosis not present

## 2020-12-13 DIAGNOSIS — R61 Generalized hyperhidrosis: Secondary | ICD-10-CM | POA: Diagnosis not present

## 2020-12-13 DIAGNOSIS — R109 Unspecified abdominal pain: Secondary | ICD-10-CM | POA: Diagnosis not present

## 2020-12-13 DIAGNOSIS — R0789 Other chest pain: Secondary | ICD-10-CM | POA: Diagnosis not present

## 2020-12-13 DIAGNOSIS — Z79899 Other long term (current) drug therapy: Secondary | ICD-10-CM | POA: Insufficient documentation

## 2020-12-13 DIAGNOSIS — R519 Headache, unspecified: Secondary | ICD-10-CM | POA: Diagnosis not present

## 2020-12-13 DIAGNOSIS — R42 Dizziness and giddiness: Secondary | ICD-10-CM | POA: Insufficient documentation

## 2020-12-13 LAB — BASIC METABOLIC PANEL
Anion gap: 10 (ref 5–15)
BUN: 17 mg/dL (ref 6–20)
CO2: 26 mmol/L (ref 22–32)
Calcium: 9.9 mg/dL (ref 8.9–10.3)
Chloride: 101 mmol/L (ref 98–111)
Creatinine, Ser: 1.26 mg/dL — ABNORMAL HIGH (ref 0.61–1.24)
GFR, Estimated: >60 mL/min (ref >60)
Glucose, Bld: 104 mg/dL — ABNORMAL HIGH (ref 70–99)
Potassium: 4.3 mmol/L (ref 3.5–5.1)
Sodium: 137 mmol/L (ref 135–145)

## 2020-12-13 LAB — HEPATIC FUNCTION PANEL
ALT: 26 U/L (ref 0–44)
AST: 26 U/L (ref 15–41)
Albumin: 4.2 g/dL (ref 3.5–5.0)
Alkaline Phosphatase: 74 U/L (ref 38–126)
Bilirubin, Direct: 0.1 mg/dL (ref 0.0–0.2)
Indirect Bilirubin: 1 mg/dL — ABNORMAL HIGH (ref 0.3–0.9)
Total Bilirubin: 1.1 mg/dL (ref 0.3–1.2)
Total Protein: 7.7 g/dL (ref 6.5–8.1)

## 2020-12-13 LAB — CBC
HCT: 42.5 % (ref 39.0–52.0)
Hemoglobin: 14.4 g/dL (ref 13.0–17.0)
MCH: 30.6 pg (ref 26.0–34.0)
MCHC: 33.9 g/dL (ref 30.0–36.0)
MCV: 90.4 fL (ref 80.0–100.0)
Platelets: 217 10*3/uL (ref 150–400)
RBC: 4.7 MIL/uL (ref 4.22–5.81)
RDW: 12.2 % (ref 11.5–15.5)
WBC: 7.2 10*3/uL (ref 4.0–10.5)
nRBC: 0 % (ref 0.0–0.2)

## 2020-12-13 LAB — TROPONIN I (HIGH SENSITIVITY): Troponin I (High Sensitivity): 7 ng/L (ref <18)

## 2020-12-13 IMAGING — CT CT CERVICAL SPINE W/O CM
3 of 4 series · 12 of 33 positions shown, 14 images · non-contrast
Comparison: [DATE], [DATE]

CLINICAL DATA: Previous spinal fusion, neck injury 6 weeks ago,
headache and dizziness

EXAM:
CT CERVICAL SPINE WITHOUT CONTRAST
TECHNIQUE: Multidetector CT imaging of the cervical spine was performed without
intravenous contrast. Multiplanar CT image reconstructions were also
generated.

[Series 4: sagittal bone · sagittal · 0.35mm/px · 5 of 88 slices shown, 6 images]
[im 30/88  bone]
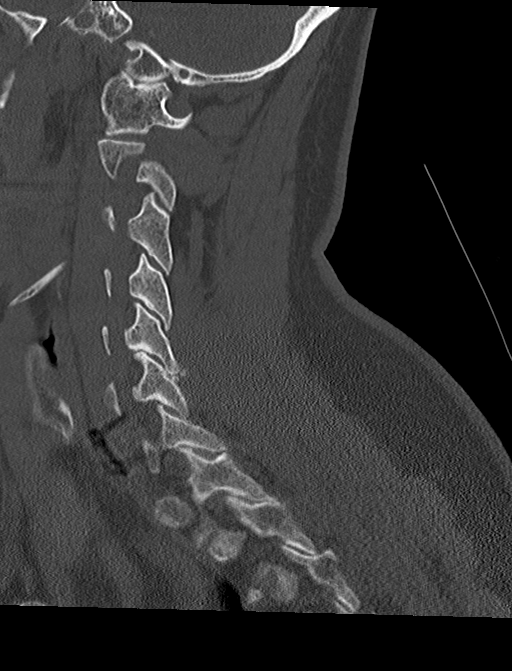
[im 37/88  bone]
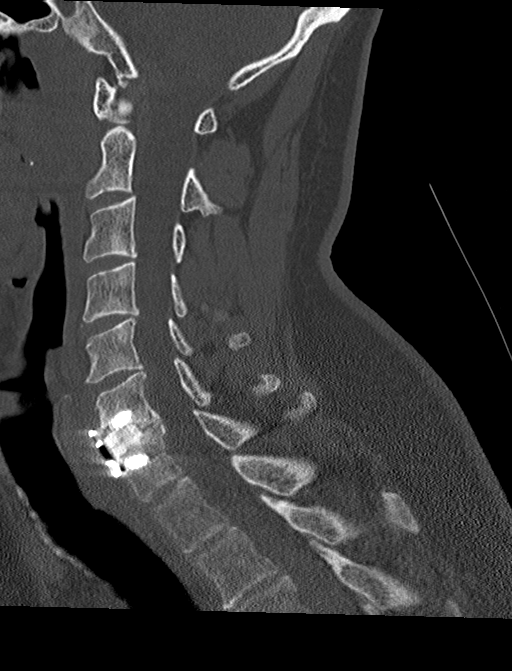
[im 44/88  soft-tissue]
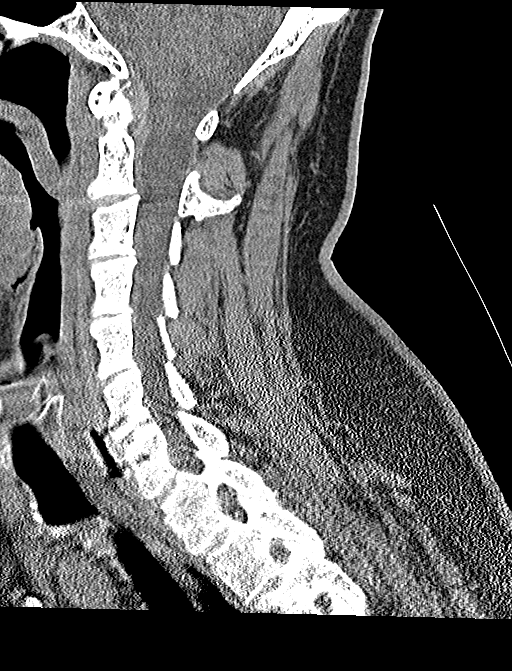
[im 44/88  bone]
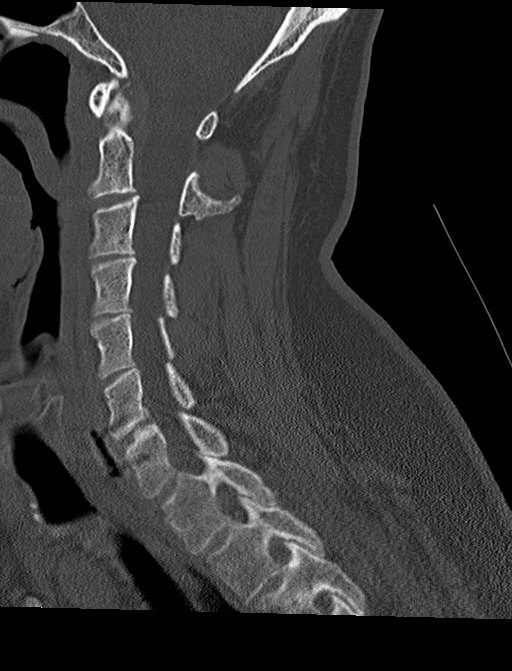
[im 51/88  bone]
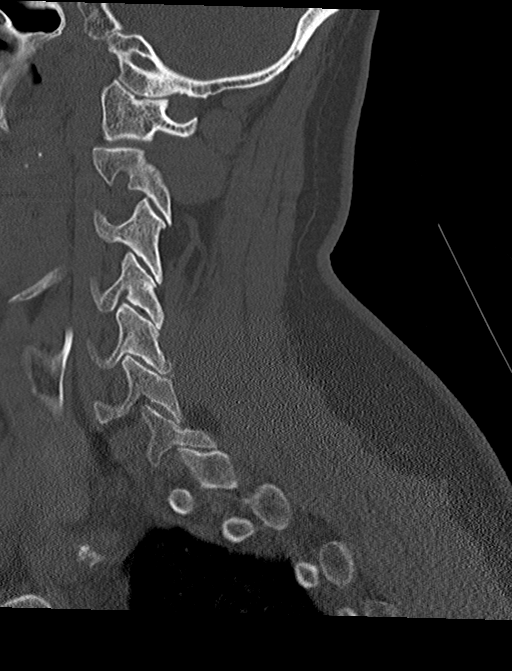
[im 59/88  bone]
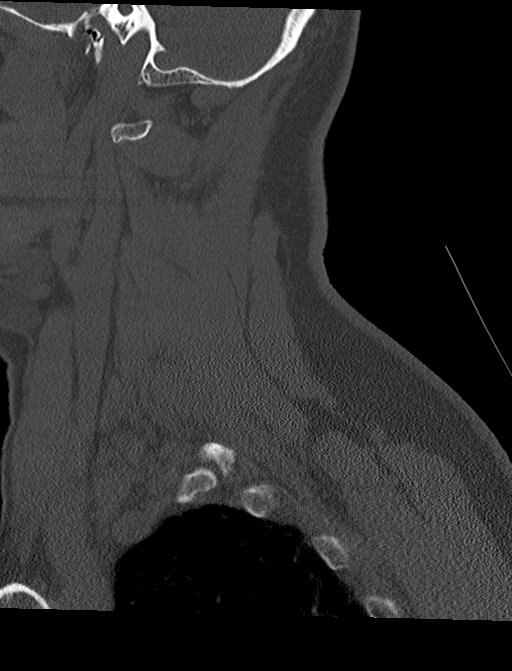

[Series 5: coronal bone · coronal · 0.36mm/px · 3 of 142 slices shown]
[im 29/142  bone]
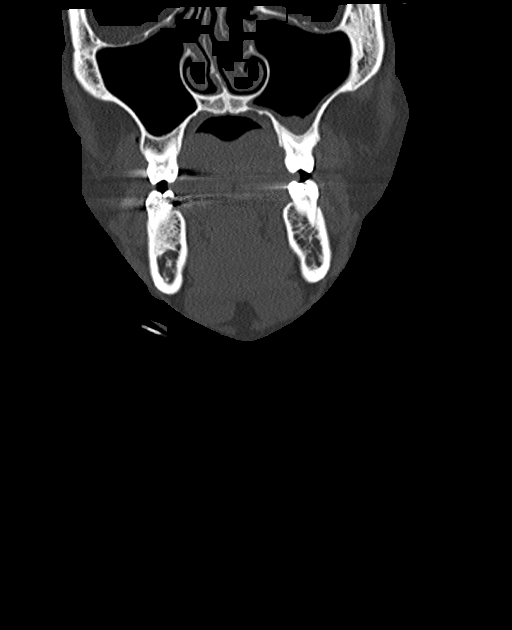
[im 57/142  bone]
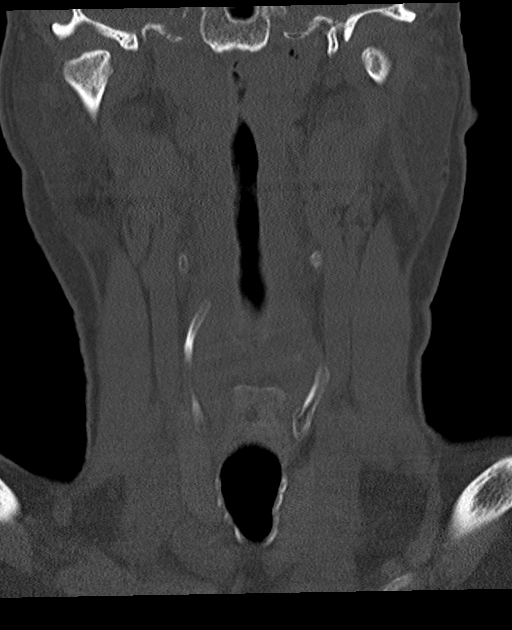
[im 85/142  bone]
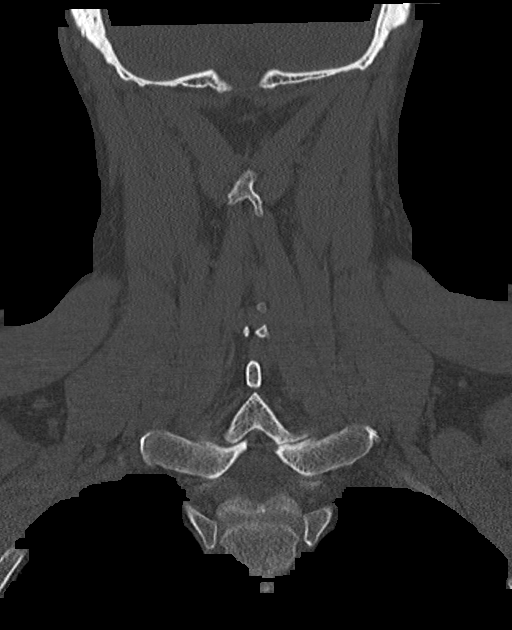

[Series 6: orthogonal bone · axial · 0.40mm/px · z∈[-321,-177]mm · 4 of 108 slices shown, 5 images]
[im 18/108  soft-tissue]
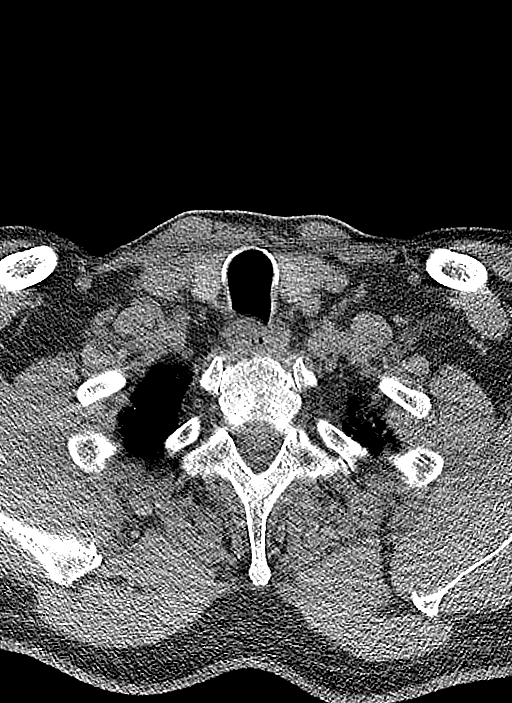
[im 18/108  bone]
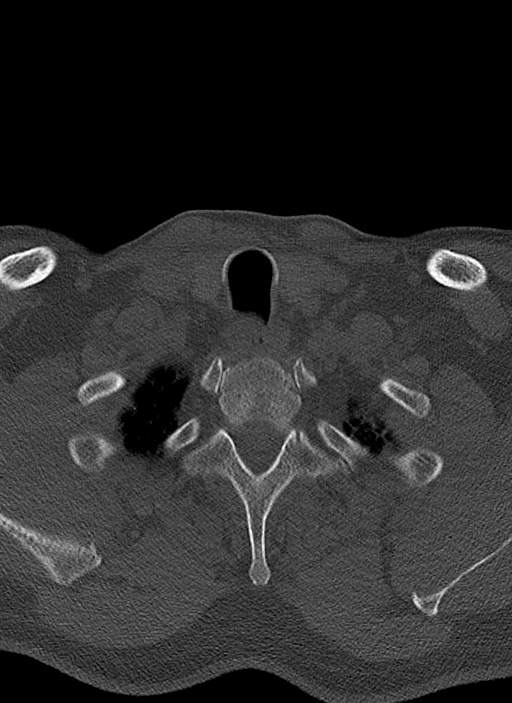
[im 36/108  bone]
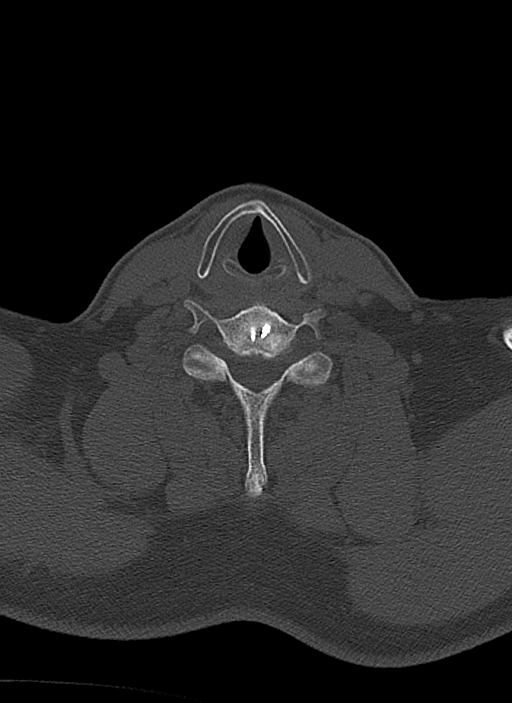
[im 72/108  bone]
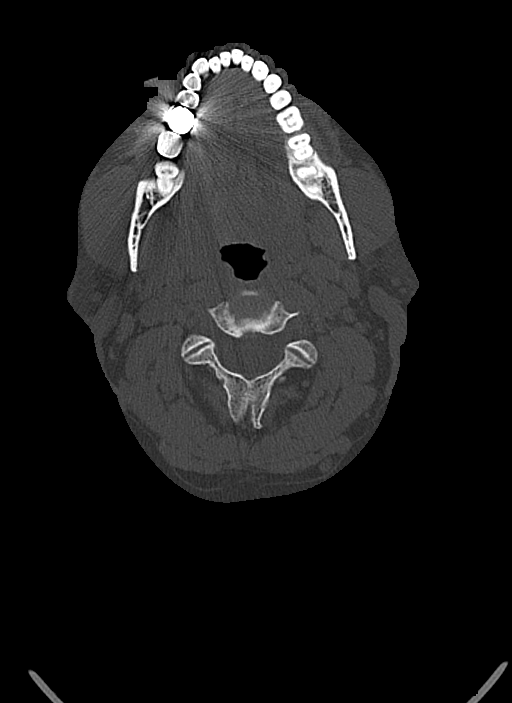
[im 90/108  bone]
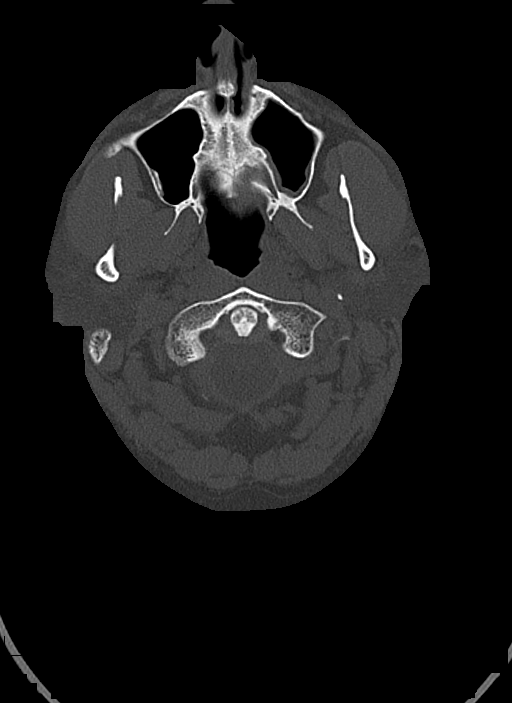

[12 of 33 positions shown; findings below may reference images not displayed]

FINDINGS: Alignment: Alignment is anatomic.

Skull base and vertebrae: No acute fracture. No primary bone lesion
or focal pathologic process.

Soft tissues and spinal canal: No prevertebral fluid or swelling. No
visible canal hematoma.

Disc levels: Prior C6-7 ACDF with no evidence of metallic hardware
failure or loosening. There is mild uncovertebral hypertrophy at
C4-5 and C5-6, without significant neural foraminal encroachment.

At C5-6 there is broad-based disc bulge, resulting in at least mild
central canal stenosis.

Upper chest: Central airway is patent.  Lung apices are clear.

Other: Reconstructed images demonstrate no additional findings.
IMPRESSION: 1. No acute cervical spine fracture.
2. Broad-based disc bulge and bilateral uncovertebral hypertrophy at
C5-6, resulting in at least mild central canal stenosis.
3. Stable ACDF at C6-7.

## 2020-12-13 IMAGING — CT CT HEAD W/O CM
3 series · 15 of 47 positions shown, 18 images · non-contrast
Comparison: None.

CLINICAL DATA: Neck surgery several years ago, neck injury 6 weeks
ago, headache and dizziness

EXAM:
CT HEAD WITHOUT CONTRAST
TECHNIQUE: Contiguous axial images were obtained from the base of the skull
through the vertex without intravenous contrast.

[Series 3: head wo · axial · 0.46mm/px · z∈[-148,-18]mm · 9 of 32 slices shown, 12 images]
[im 3/32  brain]
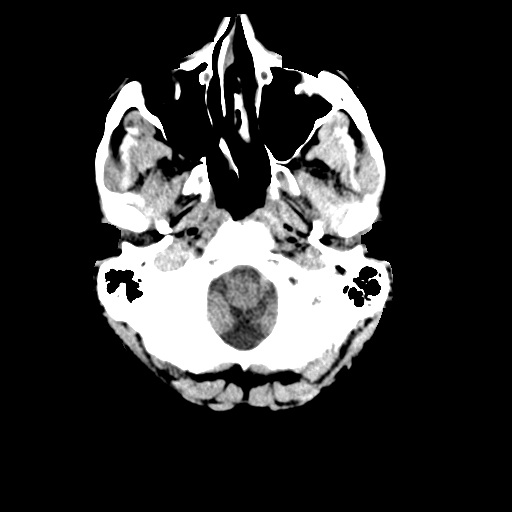
[im 3/32  bone]
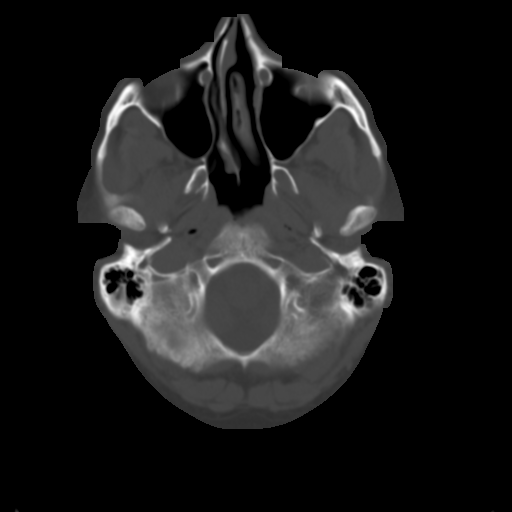
[im 6/32  brain]
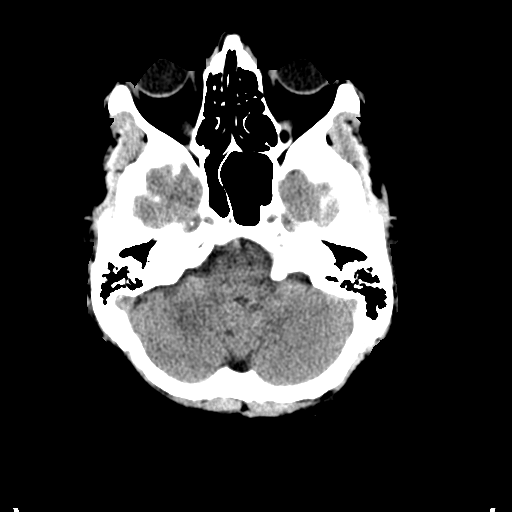
[im 9/32  brain]
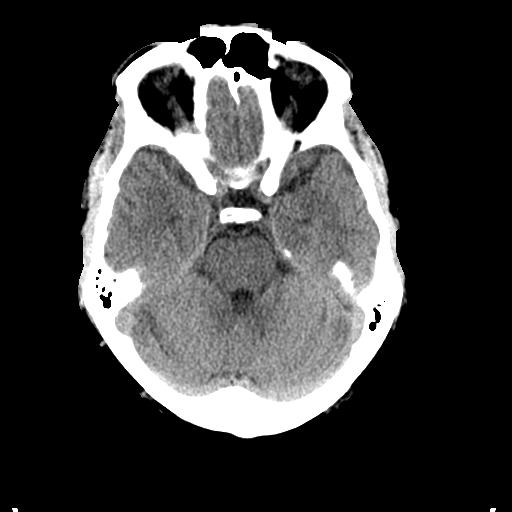
[im 12/32  brain]
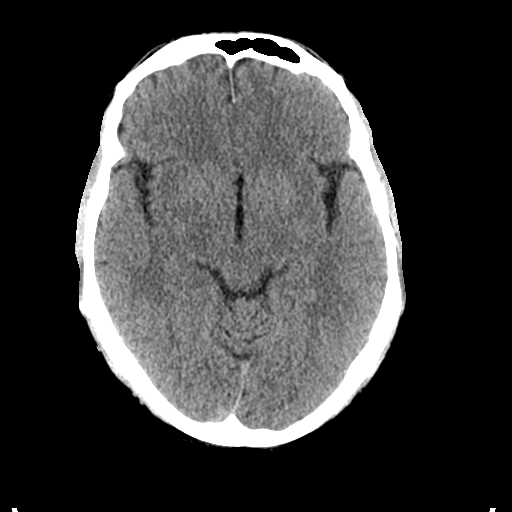
[im 17/32  brain]
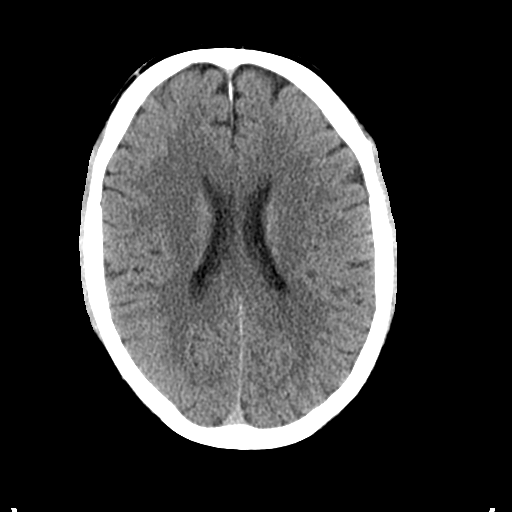
[im 17/32  bone]
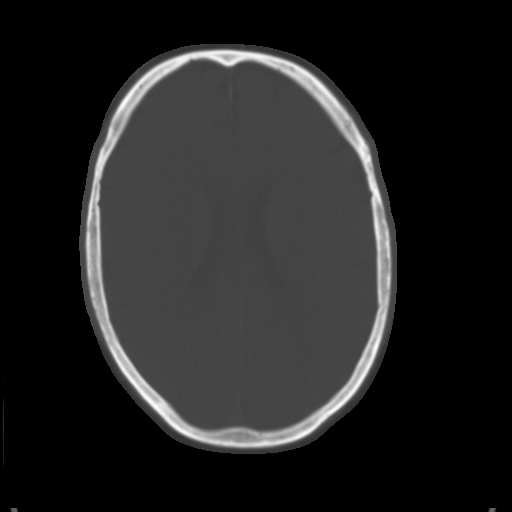
[im 20/32  brain]
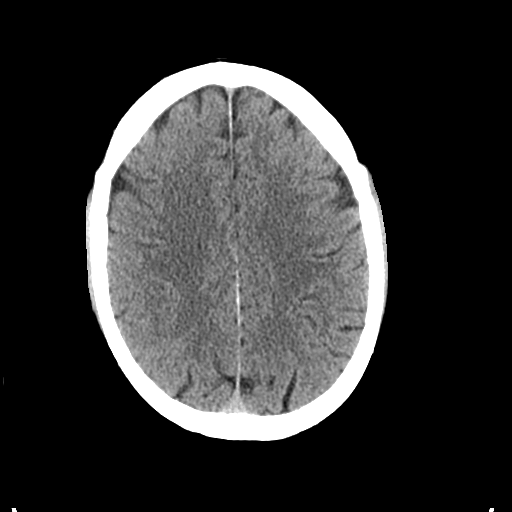
[im 23/32  brain]
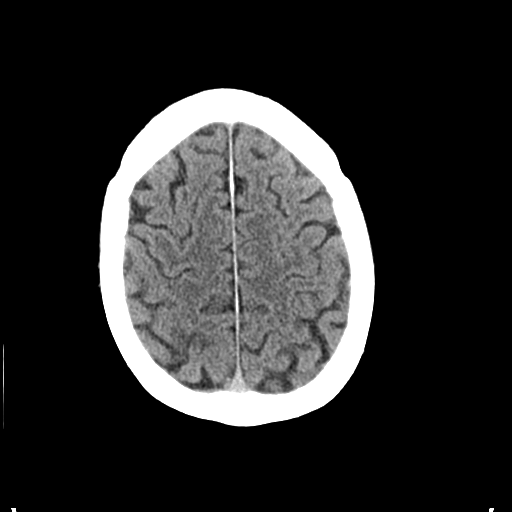
[im 26/32  brain]
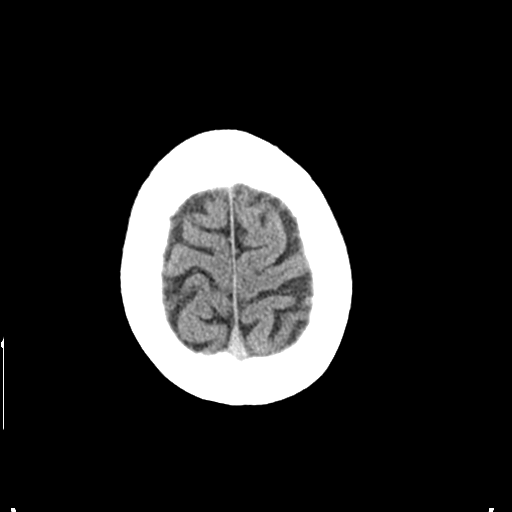
[im 29/32  brain]
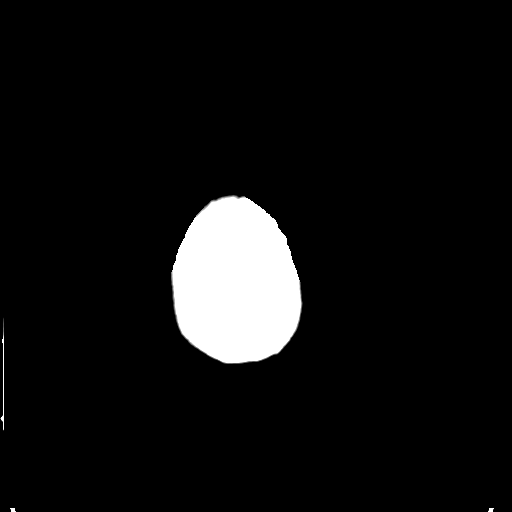
[im 29/32  bone]
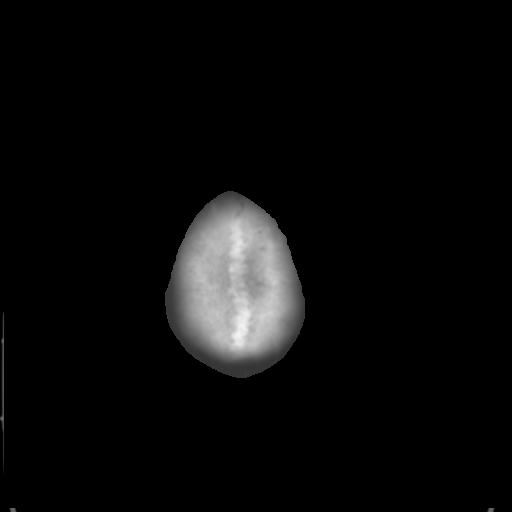

[Series 4: coronal soft tissue · coronal · 0.30mm/px · 3 of 71 slices shown]
[im 24/71  brain]
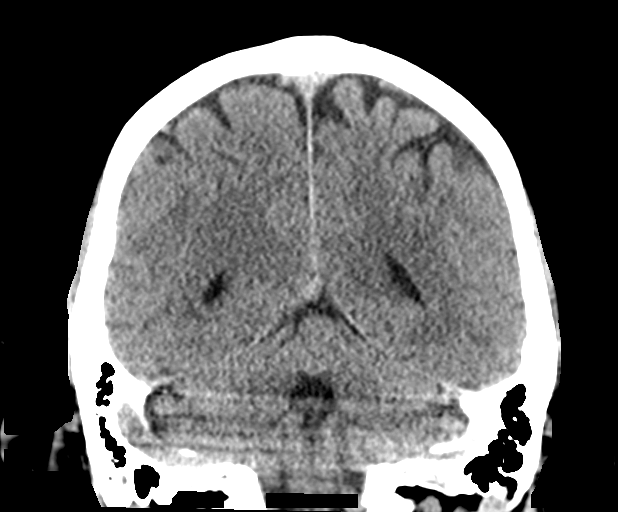
[im 32/71  brain]
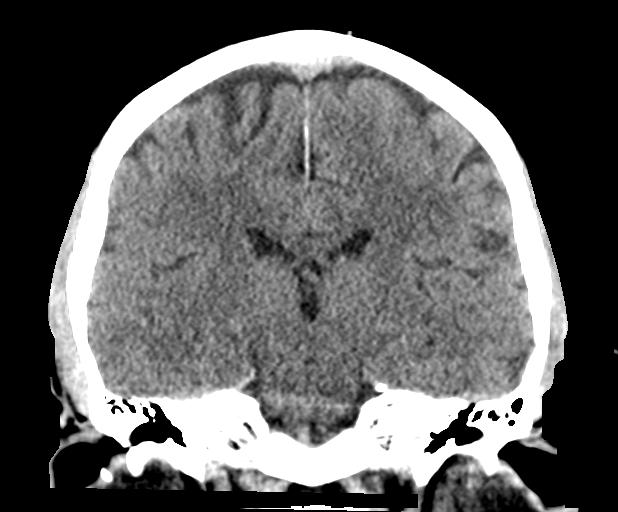
[im 39/71  brain]
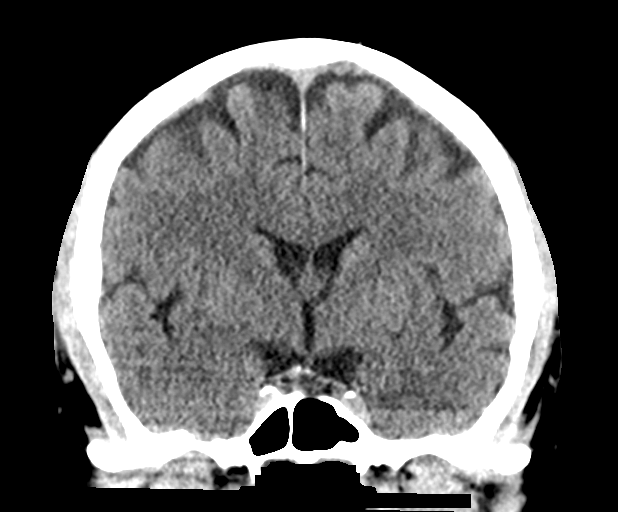

[Series 5: sagittal soft tissue · sagittal · 0.32mm/px · 3 of 63 slices shown]
[im 21/63  brain]
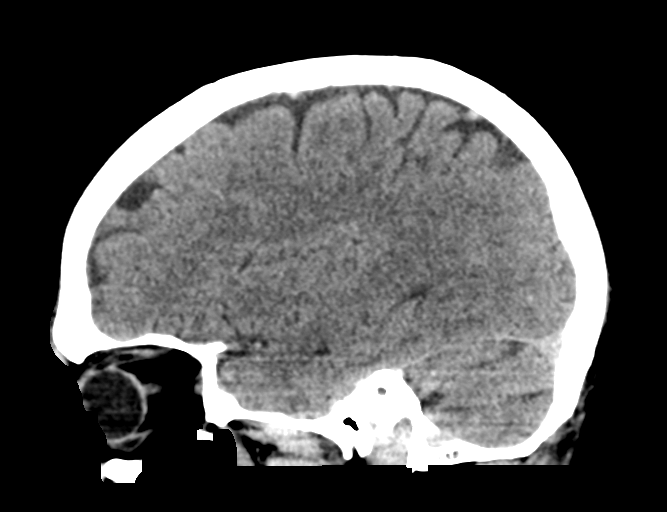
[im 32/63  brain]
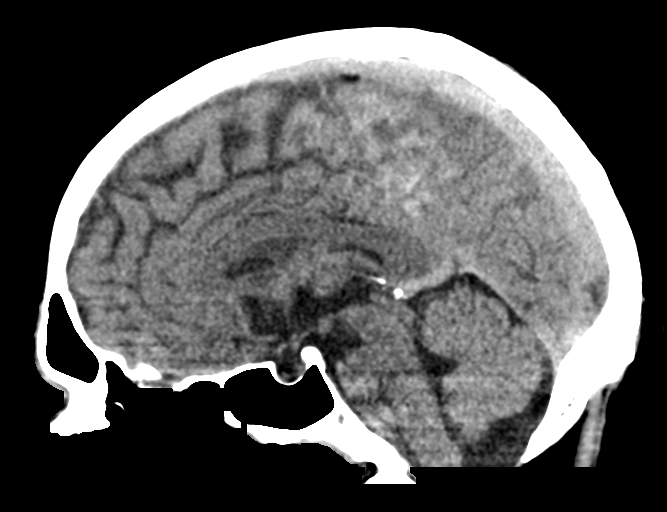
[im 42/63  brain]
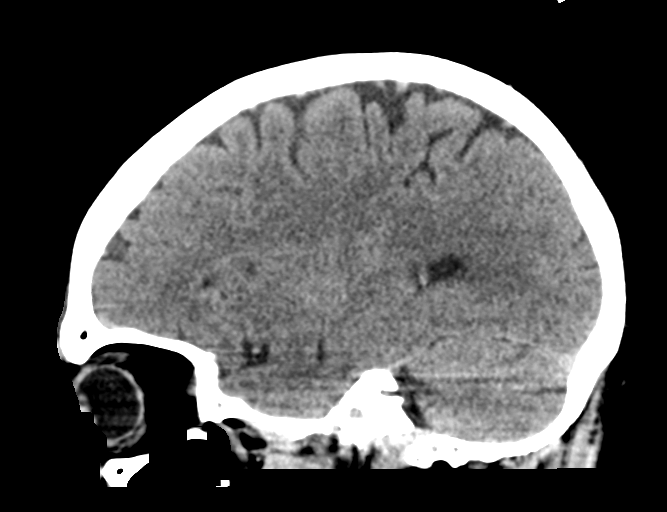

[15 of 47 positions shown; findings below may reference images not displayed]

FINDINGS: Brain: No acute infarct or hemorrhage. Lateral ventricles and
midline structures are unremarkable. No acute extra-axial fluid
collections. No mass effect.

Vascular: No hyperdense vessel or unexpected calcification.

Skull: Normal. Negative for fracture or focal lesion.

Sinuses/Orbits: No acute finding.

Other: None.
IMPRESSION: 1. No acute intracranial process.

## 2020-12-13 IMAGING — MR MR CERVICAL SPINE W/O CM
5 series · 39 of 48 positions shown · non-contrast
Comparison: CT cervical spine from same day. MRI cervical spine
dated [DATE].

CLINICAL DATA: Posterior headache and dizziness for the past 6
weeks.

EXAM:
MRI CERVICAL SPINE WITHOUT CONTRAST
TECHNIQUE: Multiplanar, multisequence MR imaging of the cervical spine was
performed. No intravenous contrast was administered.

[Series 9: T2 · sagittal · 3.0mm · 0.62mm/px · 7 of 15 slices shown (1 of 2)]
[im 1/15]
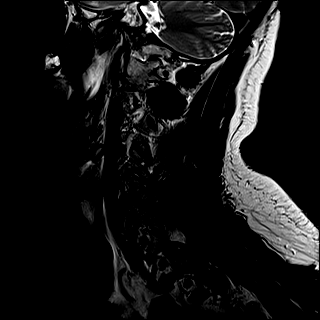
[im 3/15]
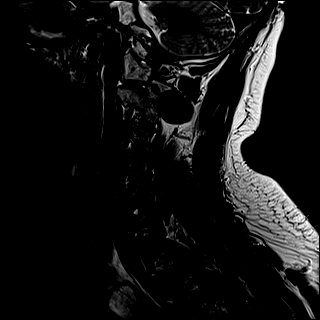
[im 5/15]
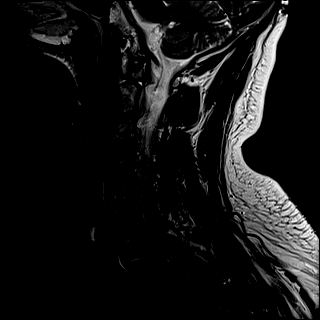
[im 8/15]
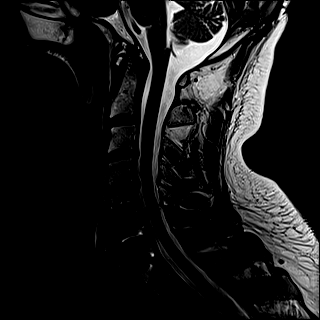
[im 10/15]
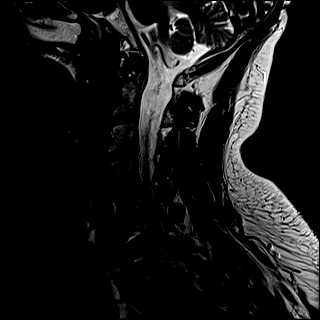
[im 12/15]
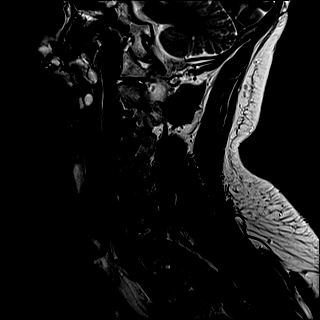
[im 15/15]
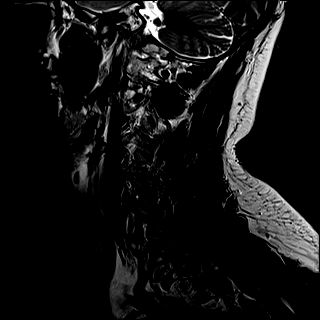

[Series 10: FLAIR · sagittal · 3.0mm · 0.78mm/px · 7 of 15 slices shown]
[im 1/15]
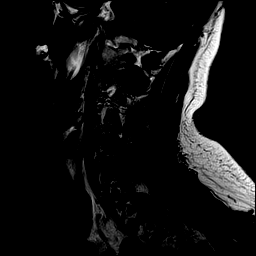
[im 3/15]
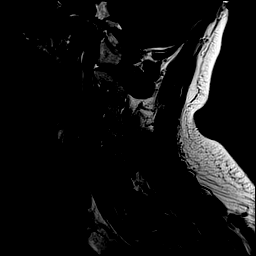
[im 5/15]
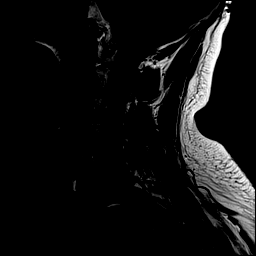
[im 8/15]
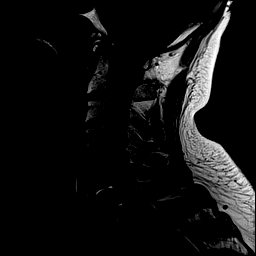
[im 10/15]
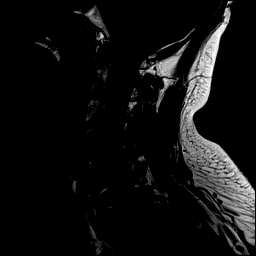
[im 12/15]
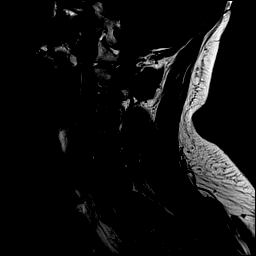
[im 15/15]
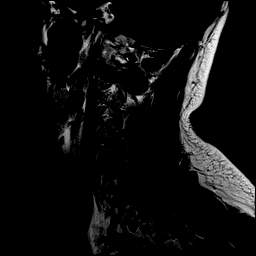

[Series 11: STIR · sagittal · 3.0mm · 0.62mm/px · 6 of 15 slices shown]
[im 1/15]
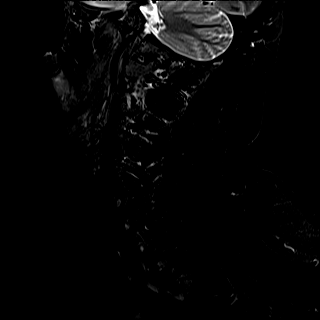
[im 3/15]
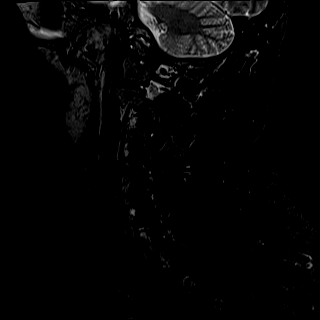
[im 6/15]
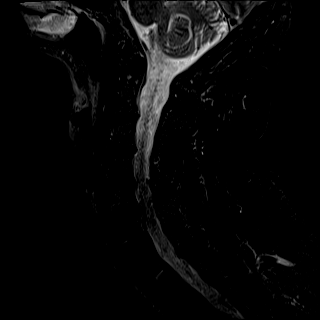
[im 9/15]
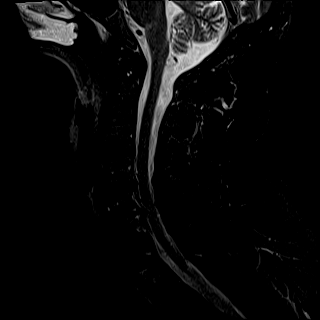
[im 12/15]
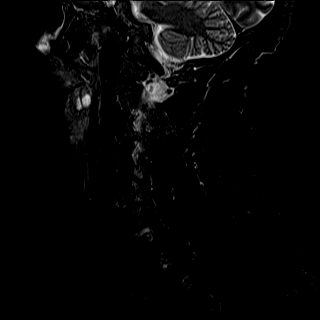
[im 15/15]
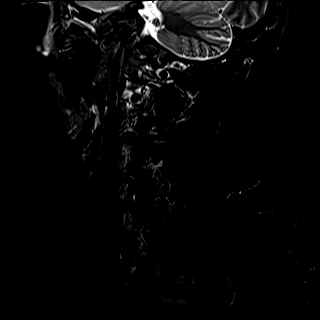

[Series 12: T2 · axial · 3.0mm · 0.70mm/px · z∈[-216,-104]mm · 11 of 34 slices shown (2 of 2)]
[im 1/34]
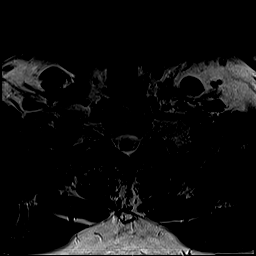
[im 3/34]
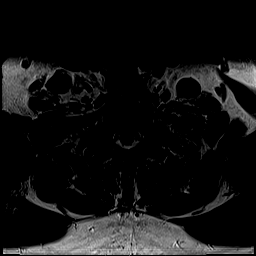
[im 6/34]
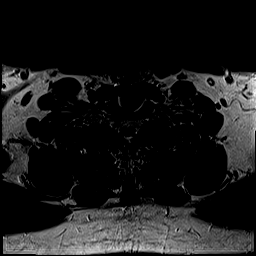
[im 8/34]
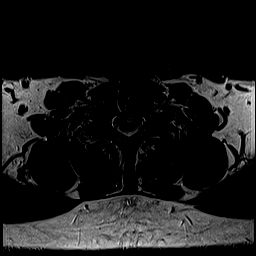
[im 11/34]
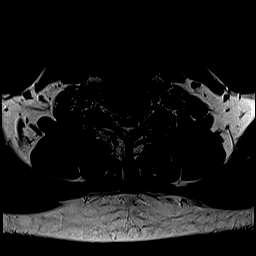
[im 13/34]
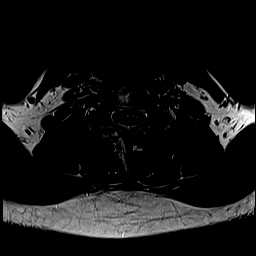
[im 16/34]
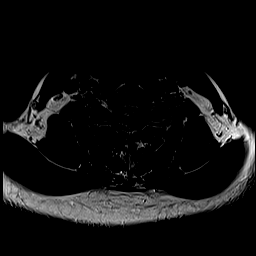
[im 18/34]
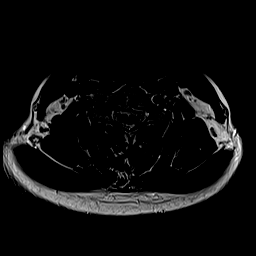
[im 23/34]
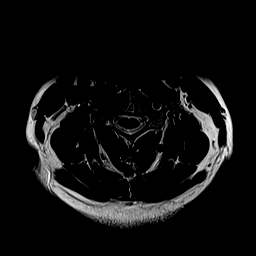
[im 28/34]
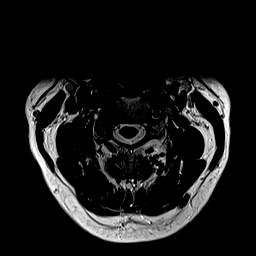
[im 34/34]
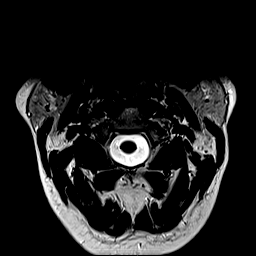

[Series 13: ax mpgr · axial · 3.0mm · 0.35mm/px · z∈[-216,-104]mm · 8 of 34 slices shown]
[im 1/34]
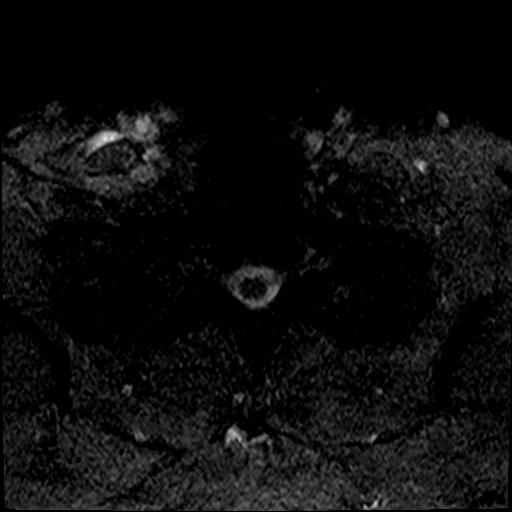
[im 6/34]
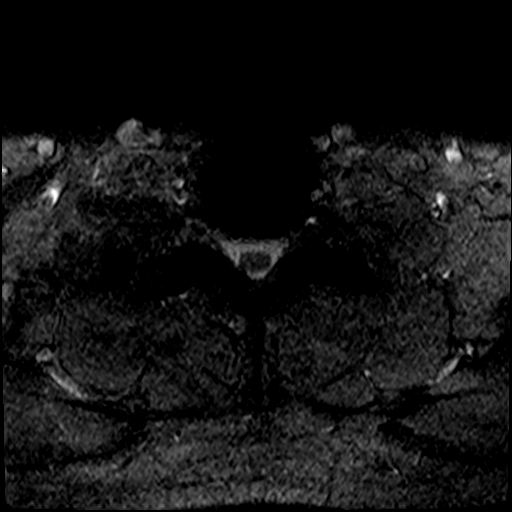
[im 11/34]
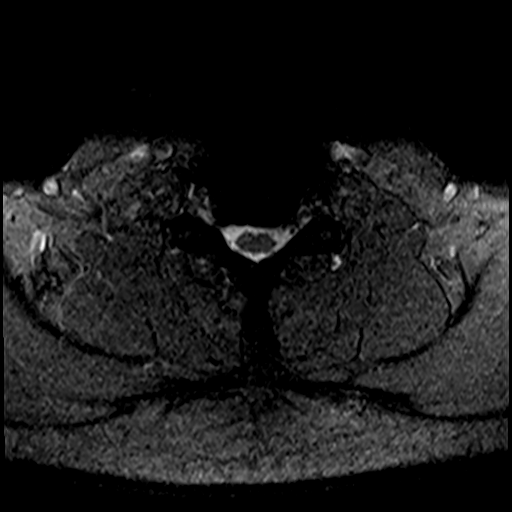
[im 16/34]
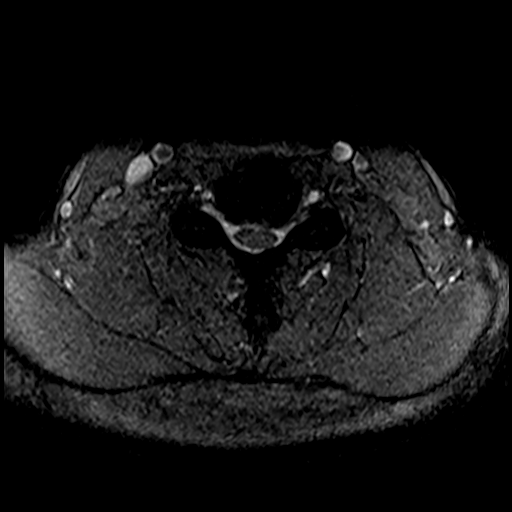
[im 18/34]
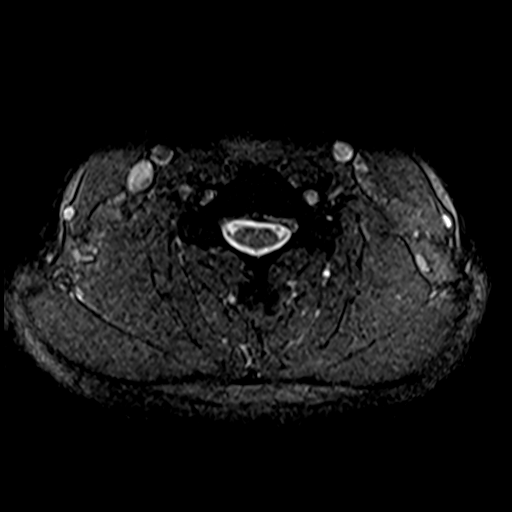
[im 23/34]
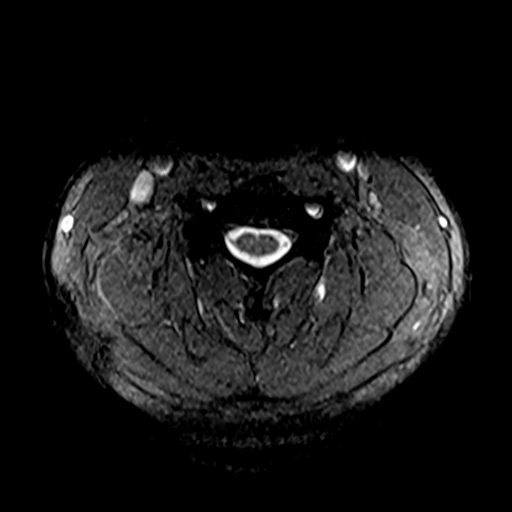
[im 28/34]
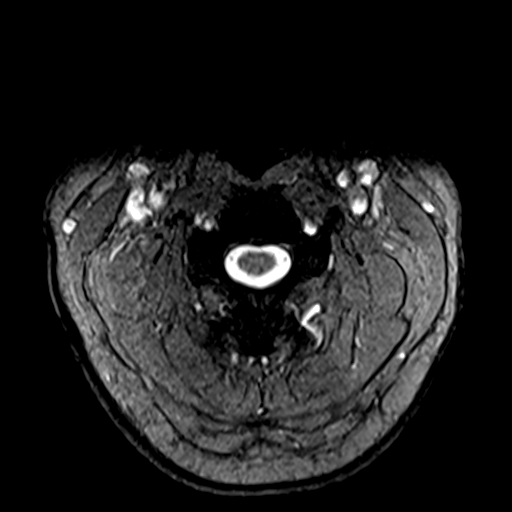
[im 34/34]
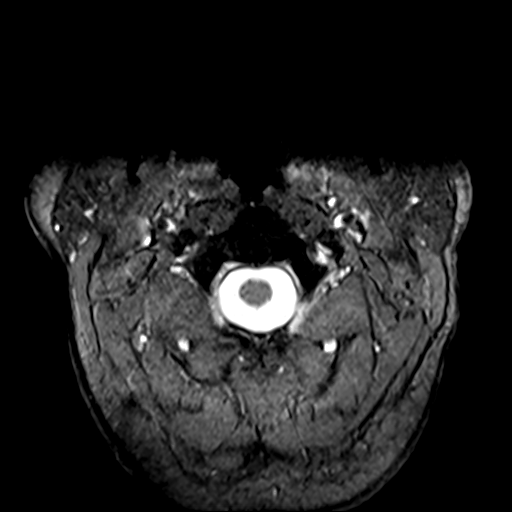

[39 of 48 positions shown; findings below may reference images not displayed]

FINDINGS: Alignment: Physiologic.

Vertebrae: No fracture, evidence of discitis, or bone lesion. Prior
C6-C7 ACDF.

Cord: Normal signal and morphology.

Posterior Fossa, vertebral arteries, paraspinal tissues: Negative.

Disc levels:

C2-C3: Unchanged small right-sided posterior disc osteophyte complex
and mild right facet arthropathy. No stenosis.

C3-C4: Unchanged small posterior disc osteophyte complex. No
stenosis.

C4-C5: Increased small left paracentral disc protrusion and left
uncovertebral hypertrophy. Unchanged mild bilateral facet
arthropathy. Progressive mild spinal canal stenosis and moderate to
severe left neuroforaminal stenosis. No right neuroforaminal
stenosis.

C5-C6: Small posterior disc osteophyte complex with new left
paracentral disc extrusion migrating inferiorly with flattening of
the left ventral cord. Unchanged mild bilateral facet arthropathy.
Increased mild to moderate spinal canal stenosis. Unchanged mild
bilateral neuroforaminal stenosis.

C6-C7: Prior ACDF. Residual mild right neuroforaminal stenosis due
to bony hypertrophy.

C7-T1: Negative disc. Mild bilateral facet arthropathy. No stenosis.
IMPRESSION: 1. Progressive mild spinal canal stenosis and moderate to severe
left neuroforaminal stenosis at C4-C5.
2. Increased mild to moderate spinal canal stenosis at C5-C6 with
new left paracentral disc extrusion migrating inferiorly, flattening
the left ventral cord.
3. Prior C6-C7 ACDF with residual mild right neuroforaminal
stenosis.

## 2020-12-13 IMAGING — MR MR HEAD W/O CM
12 series · 46 of 48 positions shown · non-contrast
Comparison: CT from earlier the same day.

CLINICAL DATA: Initial evaluation for 6 week history of persistent
posterior headaches and dizziness.

EXAM:
MRI HEAD WITHOUT CONTRAST
TECHNIQUE: Multiplanar, multiecho pulse sequences of the brain and surrounding
structures were obtained without intravenous contrast.

[Series 5: ax dwi_tracew · axial · 3.0mm · 0.65mm/px · z∈[-33,+121]mm · 5 of 48 slices shown]
[im 1/48]
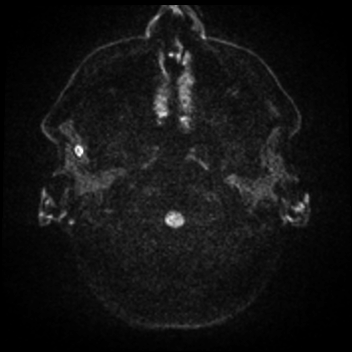
[im 12/48]
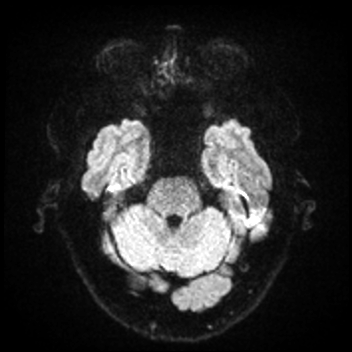
[im 24/48]
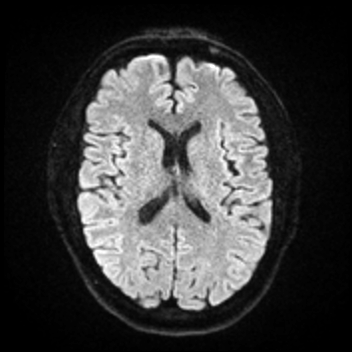
[im 36/48]
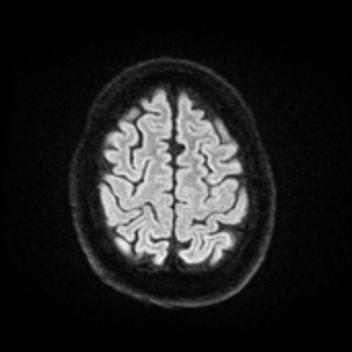
[im 48/48]
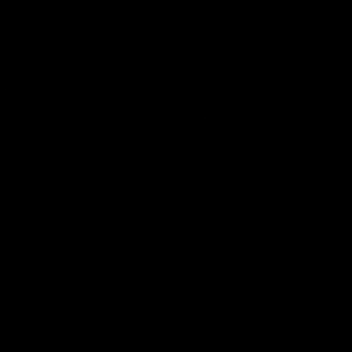

[Series 6: ax dwi_adc · axial · 3.0mm · 0.65mm/px · z∈[-33,+114]mm · 3 of 46 slices shown]
[im 1/46]
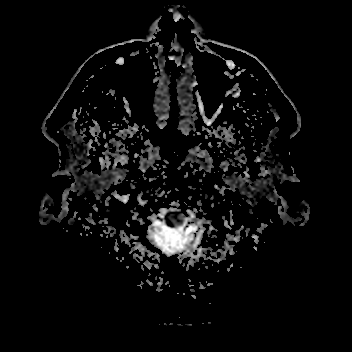
[im 23/46]
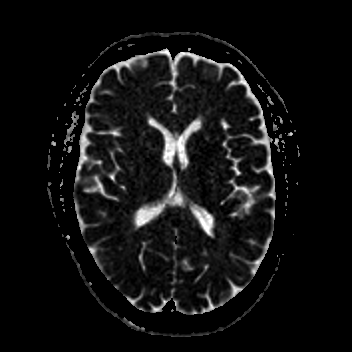
[im 46/46]
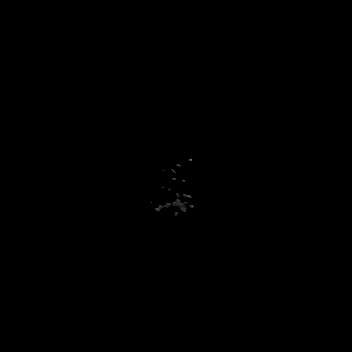

[Series 7: cor dwi_tracew · coronal · 5.0mm · 0.65mm/px · 3 of 40 slices shown]
[im 1/40]
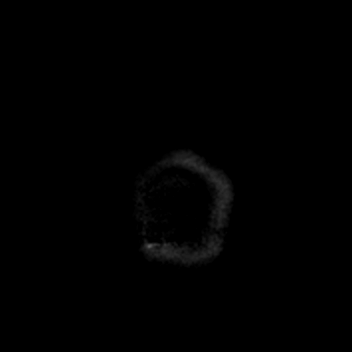
[im 20/40]
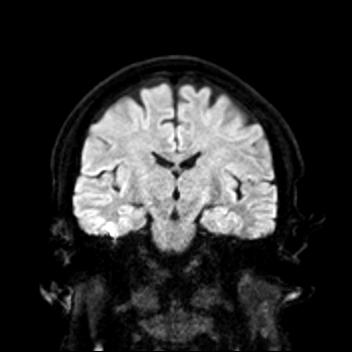
[im 40/40]
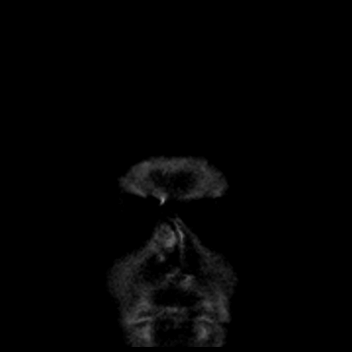

[Series 8: cor dwi_adc · coronal · 5.0mm · 0.65mm/px · 3 of 40 slices shown]
[im 1/40]
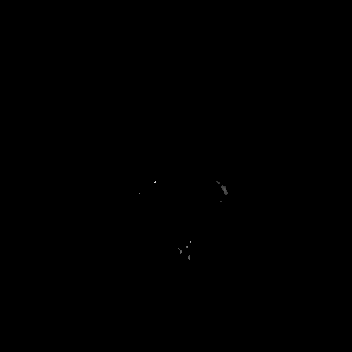
[im 20/40]
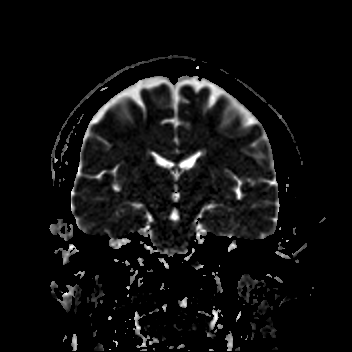
[im 40/40]
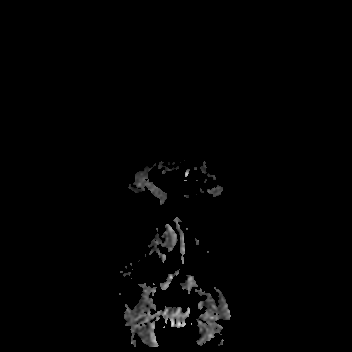

[Series 9: T1 · sagittal · 5.0mm · 0.62mm/px · 2 of 25 slices shown (1 of 2)]
[im 1/25]
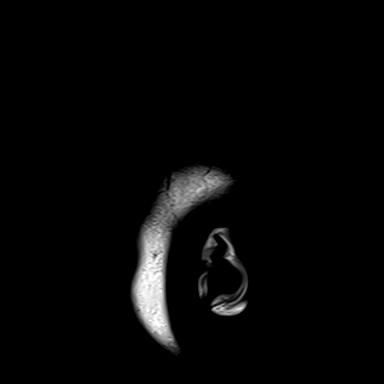
[im 25/25]
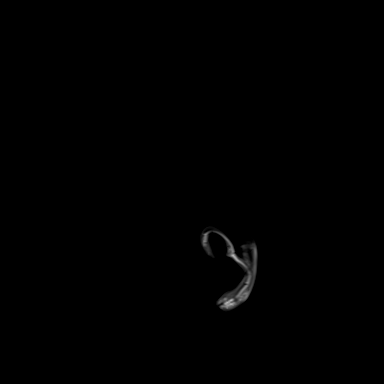

[Series 10: T2 · axial · 5.0mm · 0.53mm/px · z∈[-32,+111]mm · 2 of 25 slices shown (1 of 2)]
[im 1/25]
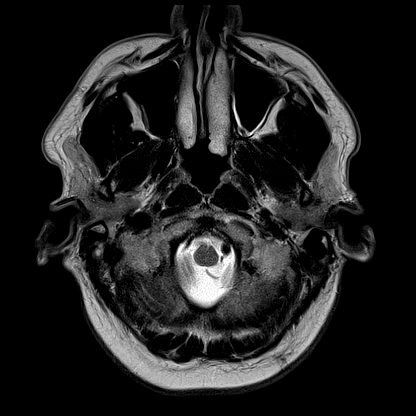
[im 25/25]
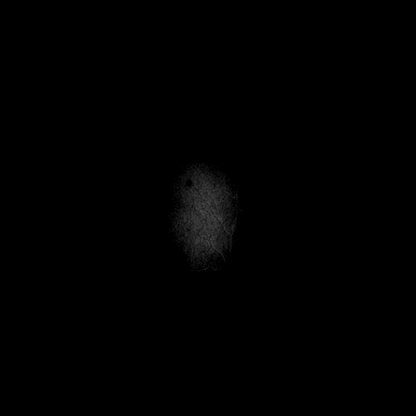

[Series 11: mag_images · axial · 3.0mm · 0.90mm/px · z∈[-47,+128]mm · 4 of 60 slices shown]
[im 1/60]
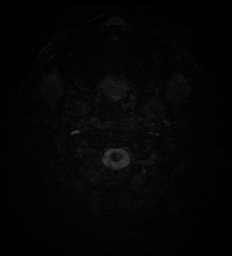
[im 20/60]
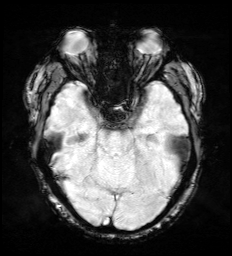
[im 40/60]
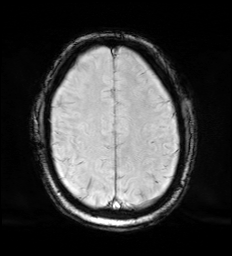
[im 60/60]
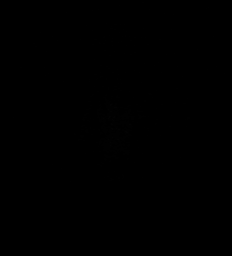

[Series 12: pha_images · axial · 3.0mm · 0.90mm/px · z∈[-47,+128]mm · 4 of 59 slices shown]
[im 1/59]
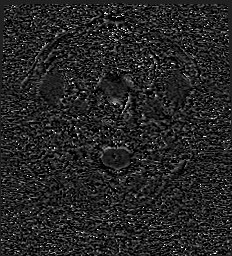
[im 20/59]
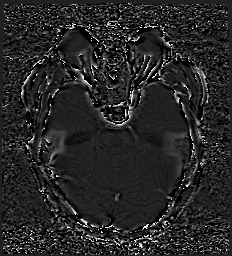
[im 39/59]
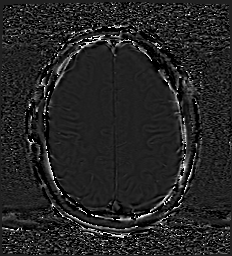
[im 59/59]
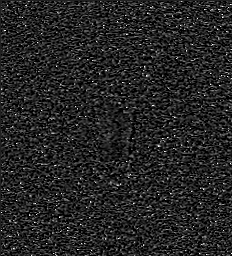

[Series 13: swi_images · axial · 3.0mm · 0.90mm/px · z∈[-47,+128]mm · 4 of 60 slices shown]
[im 1/60]
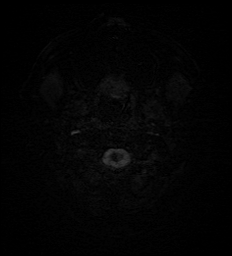
[im 20/60]
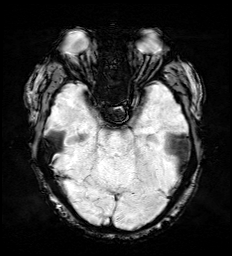
[im 40/60]
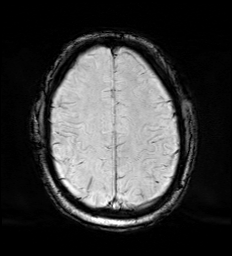
[im 60/60]
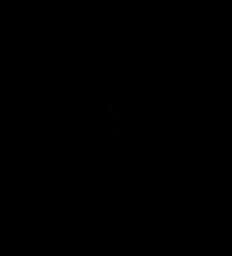

[Series 15: FLAIR · axial · 3.0mm · 0.53mm/px · z∈[-41,+120]mm · 4 of 55 slices shown]
[im 1/55]
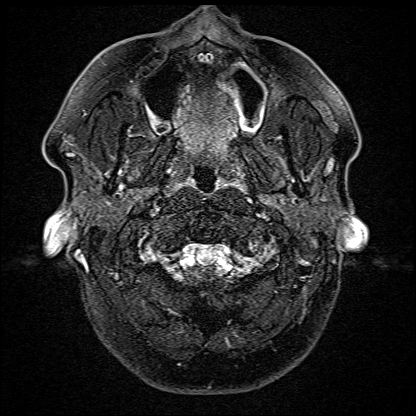
[im 19/55]
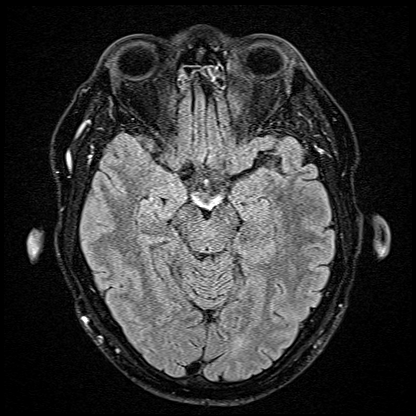
[im 37/55]
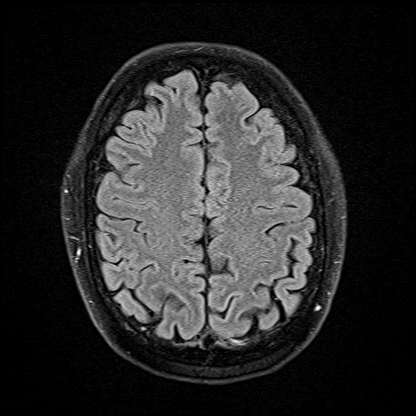
[im 55/55]
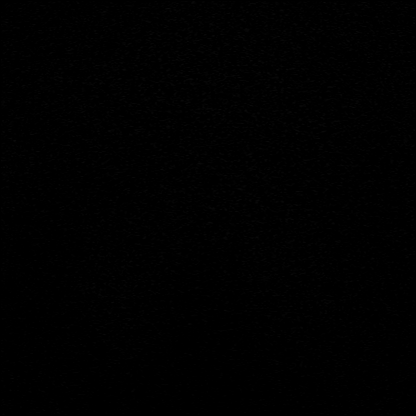

[Series 16: T1 · axial · 1.0mm · 0.98mm/px · z∈[-45,+125]mm · 10 of 170 slices shown (2 of 2)]
[im 1/170]
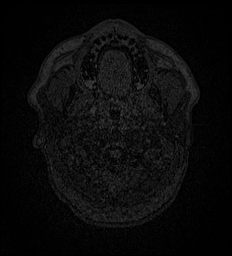
[im 16/170]
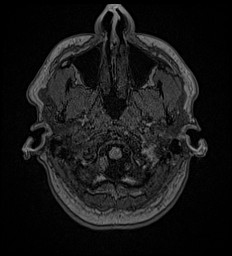
[im 31/170]
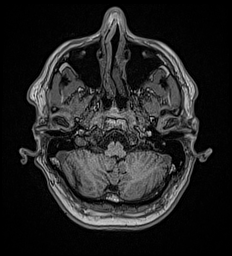
[im 47/170]
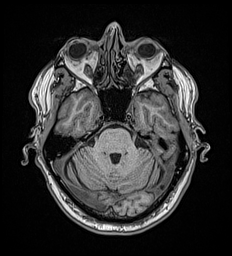
[im 62/170]
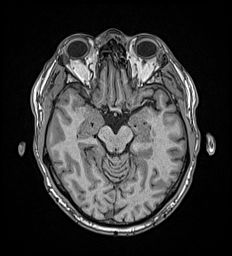
[im 77/170]
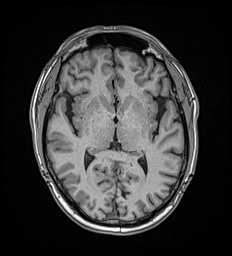
[im 93/170]
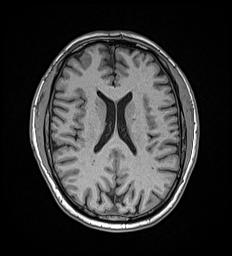
[im 123/170]
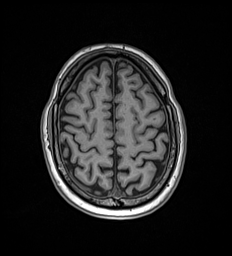
[im 139/170]
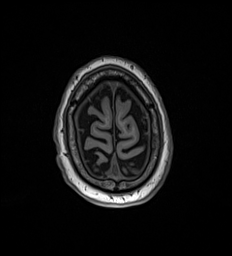
[im 170/170]
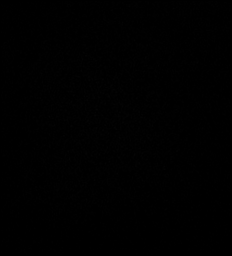

[Series 17: T2 · coronal · 5.0mm · 0.57mm/px · 2 of 29 slices shown (2 of 2)]
[im 1/29]
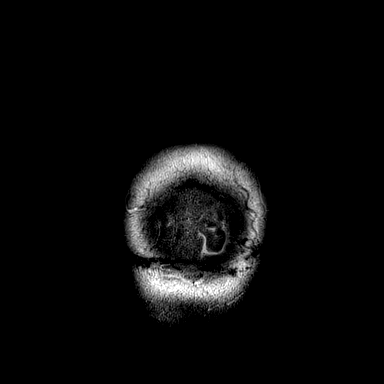
[im 29/29]
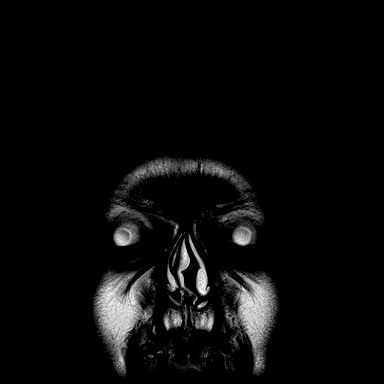

[46 of 48 positions shown; findings below may reference images not displayed]

FINDINGS: Brain: Cerebral volume within normal limits for age. Few scattered
punctate foci of T2/FLAIR hyperintensity noted involving the
supratentorial cerebral white matter, nonspecific, but overall
minimal nature, and felt to be within normal limits for age.

No abnormal foci of restricted diffusion to suggest acute or
subacute ischemia. Gray-white matter differentiation maintained. No
encephalomalacia to suggest chronic cortical infarction. No evidence
for acute or chronic intracranial hemorrhage.

No mass lesion, midline shift or mass effect. No hydrocephalus or
extra-axial fluid collection. Pituitary gland suprasellar region
within normal limits. Midline structures intact.

Vascular: Major intracranial vascular flow voids are well
maintained.

Skull and upper cervical spine: Craniocervical junction within
normal limits. Bone marrow signal intensity normal. No scalp soft
tissue abnormality.

Sinuses/Orbits: Globes and orbital soft tissues within normal
limits. Mild scattered mucosal thickening noted within the ethmoidal
air cells and maxillary sinuses. Paranasal sinuses are otherwise
clear. Trace right mastoid effusion, of doubtful significance. Inner
ear structures grossly normal.

Other: None.
IMPRESSION: Normal brain MRI for age. No acute intracranial abnormality
identified.

## 2020-12-13 MED ORDER — SODIUM CHLORIDE 0.9 % IV BOLUS
1000.0000 mL | Freq: Once | INTRAVENOUS | Status: AC
  Administered 2020-12-13: 1000 mL via INTRAVENOUS

## 2020-12-13 MED ORDER — ONDANSETRON 4 MG PO TBDP
4.0000 mg | ORAL_TABLET | Freq: Once | ORAL | Status: AC
  Administered 2020-12-13: 4 mg via ORAL
  Filled 2020-12-13: qty 1

## 2020-12-13 MED ORDER — METHYLPREDNISOLONE SODIUM SUCC 125 MG IJ SOLR
125.0000 mg | Freq: Once | INTRAMUSCULAR | Status: AC
  Administered 2020-12-13: 125 mg via INTRAVENOUS
  Filled 2020-12-13: qty 2

## 2020-12-13 MED ORDER — PREDNISONE 10 MG (21) PO TBPK: ORAL_TABLET | ORAL | 0 refills | Status: DC

## 2020-12-13 MED ORDER — HYDROCODONE-ACETAMINOPHEN 5-325 MG PO TABS
1.0000 | ORAL_TABLET | Freq: Once | ORAL | Status: AC
  Administered 2020-12-13: 1 via ORAL
  Filled 2020-12-13: qty 1

## 2020-12-13 MED ORDER — MECLIZINE HCL 25 MG PO TABS
25.0000 mg | ORAL_TABLET | Freq: Once | ORAL | Status: AC
  Administered 2020-12-13: 25 mg via ORAL
  Filled 2020-12-13: qty 1

## 2020-12-13 NOTE — ED Provider Notes (Signed)
  Physical Exam  BP (!) 158/78 (BP Location: Left Arm)   Pulse (!) 54   Temp 98 F (36.7 C) (Oral)   Resp 18   Ht 6\' 1"  (1.854 m)   Wt 98.4 kg   SpO2 95%   BMI 28.63 kg/m   Physical Exam  ED Course/Procedures   Clinical Course as of 12/13/20 2126  Tue Dec 13, 2020  1532 CT Cervical Spine Wo Contrast [JM]    Clinical Course User Index [JM] Menshew, 1533, PA-C    Procedures  MDM   Assumed patient care from [facility].  MR brain shows no acute abnormality.  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  Patient has follow-up appointment with his neurosurgeon next week on Thursday, Dr. Tuesday.  We will give patient IV Solu-Medrol in the emergency department prior to discharge and will start him on a tapered steroid until he can see neurosurgery.  Patient and wife feel comfortable with plan and have no additional questions.       Marcell Barlow [place], [place] 12/13/20 2128    2129, MD 12/14/20 1041

## 2020-12-13 NOTE — ED Provider Notes (Signed)
[facility] Emergency Department Provider Note  ____________________________________________   Event Date/Time   First MD Initiated Contact with Patient 12/13/20 1358     (approximate)  I have reviewed the triage vital signs and the nursing notes.   HISTORY  Chief Complaint Neck Pain and Dizziness  HPI Kyle Gallegos is a 55 y.o. male below medical history, including a C6-7 ACDF presents to the ED for 6 weeks of persistent posterior headache and dizziness.  Patient reports a mechanical injury where he was lifting a large dumpster band, and sustained an immediate nerve injury to his neck.  Since that time he is reporting a persistent posterior headache from the base of the neck to the top of the head.  He is also reporting some intermittent dizziness.  Patient not taking any medications in the interim to alleviate his symptoms.  He also not been evaluated by his provider for his ongoing symptoms.  Patient is also reporting some unsteady gait, that his wife confirms.  If the patient closes eyes and tips his head back, he loses his balance and nearly falls.  Patient also gave report of a sporadic incident that occurred about 3 weeks ago while at work.  Patient works outdoors, and apparently had an episode of diaphoresis, severe abdominal cramping, and near syncope.  He reports tightness to the chest at that time, but denies any outright syncope.  He apparently experienced incontinence of bladder and bowel at that time, patient reports he got up and walked to his car without any of his workers seen him, and went home without incident.  He did not report the episode to his wife, did not seek medical care following it.  Patient denies any history of ongoing chest pain, shortness of breath, or syncope.  He denies any visual disturbance, facial droop, paralysis, or seizure-like activity.     Past Medical History:  Diagnosis Date   Anxiety    Arthritis    Barrett's esophagus     Chronic kidney disease    STAGE iii CKD   Depression    GERD (gastroesophageal reflux disease)    Hiatal hernia    BARRETTS ESOPHAGUS   History of kidney stones    Nausea 04/24/2018   Nausea most of the time for the last month. Vomiting on/off.    Patient Active Problem List   Diagnosis Date Noted   Barrett's esophagus without dysplasia 04/10/2018   Epigastric pain 04/10/2018   Functional dyspepsia 04/10/2018   Gastroesophageal reflux disease without esophagitis 04/05/2018   Anxiety, mild 05/28/2016   Pure hypercholesterolemia 05/28/2016   Vitamin D deficiency 09/22/2013    Past Surgical History:  Procedure Laterality Date   ANTERIOR CERVICAL DECOMP/DISCECTOMY FUSION N/A 08/12/2019   Procedure: ANTERIOR CERVICAL DECOMPRESSION/DISCECTOMY FUSION 1 LEVEL C6-7;  Surgeon: Venetia Night, MD;  Location: [facility] ORS;  Service: Neurosurgery;  Laterality: N/A;   COLONOSCOPY  2018   Dr Norma Fredrickson   FOOT SURGERY Left 2014   NERVE REMOVED   FOOT SURGERY Right 2016   NERVE REMOVED   UPPER GI ENDOSCOPY  2018   Dr Norma Fredrickson   VASECTOMY      Prior to Admission medications   Medication Sig Start Date End Date Taking? Authorizing Provider  atorvastatin (LIPITOR) 10 MG tablet Take 5 mg by mouth daily.  05/30/17   [provider]  buPROPion (WELLBUTRIN XL) 150 MG 24 hr tablet Take 150 mg by mouth daily.  02/21/15   [provider]  cholecalciferol (  VITAMIN D3) 25 MCG (1000 UNIT) tablet Take 1,000 Units by mouth daily.    [provider]  escitalopram (LEXAPRO) 10 MG tablet Take 5 mg by mouth daily. 02/03/15   [provider]  pantoprazole (PROTONIX) 40 MG tablet Take 40 mg by mouth daily.  04/10/17   [provider]    Allergies Patient has no known allergies.  Family History  Problem Relation Age of Onset   Breast cancer Mother    Bladder Cancer Mother    Heart attack Mother    Stroke Father     Social History Social History   Tobacco  Use   Smoking status: Never   Smokeless tobacco: Never  Vaping Use   Vaping Use: Never used  Substance Use Topics   Alcohol use: Yes    Comment: occ    Drug use: No    Review of Systems  Constitutional: No fever/chills Eyes: No visual changes. ENT: No sore throat. Cardiovascular: Denies chest pain. Respiratory: Denies shortness of breath. Gastrointestinal: No abdominal pain.  No nausea, no vomiting.  No diarrhea.  No constipation. Genitourinary: Negative for dysuria. Musculoskeletal: Negative for back pain. Skin: Negative for rash. Neurological: Positive for persistent posterior headaches, focal weakness or numbness.  Reports dizziness and ataxia as above. ____________________________________________   PHYSICAL EXAM:  VITAL SIGNS: ED Triage Vitals  Enc Vitals Group     BP 12/13/20 1207 (!) 142/101     Pulse Rate 12/13/20 1207 (!) 58     Resp 12/13/20 1207 16     Temp 12/13/20 1207 98 F (36.7 C)     Temp Source 12/13/20 1207 Oral     SpO2 12/13/20 1207 97 %     Weight 12/13/20 1207 217 lb (98.4 kg)     Height 12/13/20 1207 6\' 1"  (1.854 m)     Head Circumference --      Peak Flow --      Pain Score 12/13/20 1212 7     Pain Loc --      Pain Edu? --      Excl. in GC? --     Constitutional: Alert and oriented. Well appearing and in no acute distress. Eyes: Conjunctivae are normal. PERRL. EOMI. Head: Atraumatic. Nose: No congestion/rhinnorhea. Mouth/Throat: Mucous membranes are moist.  Oropharynx non-erythematous. Neck: No stridor.  No cervical spine tenderness to palpation. Hematological/Lymphatic/Immunilogical: No cervical lymphadenopathy. Cardiovascular: Normal rate, regular rhythm. Grossly normal heart sounds.  Good peripheral circulation. Respiratory: Normal respiratory effort.  No retractions. Lungs CTAB. Gastrointestinal: Soft and nontender. No distention. No abdominal bruits. No CVA tenderness. Musculoskeletal: No lower extremity tenderness nor edema.  No  joint effusions. Neurologic: Cranial nerves II to XII grossly intact.  Normal UE/LE DTRs bilaterally.  Normal rapid alternating movement.  Negative pronator drift, patient is able to correct his balance.  Normal tandem walk.  Normal speech and language. No gross focal neurologic deficits are appreciated.  Mildly antalgic gait without sustained instability. Skin:  Skin is warm, dry and intact. No rash noted. Psychiatric: Mood and affect are normal. Speech and behavior are normal.  ____________________________________________   LABS (all labs ordered are listed, but only abnormal results are displayed)  Labs Reviewed  BASIC METABOLIC PANEL - Abnormal; Notable for the following components:      Result Value   Glucose, Bld 104 (*)    Creatinine, Ser 1.26 (*)    All other components within normal limits  HEPATIC FUNCTION PANEL - Abnormal; Notable for the following components:  Indirect Bilirubin 1.0 (*)    All other components within normal limits  CBC  URINALYSIS, COMPLETE (UACMP) WITH MICROSCOPIC  CBG MONITORING, ED  TROPONIN I (HIGH SENSITIVITY)   ____________________________________________  EKG  Vent. rate 62 BPM PR interval 140 ms QRS duration 88 ms QT/QTcB 400/406 ms P-R-T axes 29 20 -20 ____________________________________________  RADIOLOGY I, Lissa Hoard, personally viewed and evaluated these images (plain radiographs) as part of my medical decision making, as well as reviewing the written report by the radiologist.  ED MD interpretation:  agree with reports  Official radiology report(s): CT HEAD WO CONTRAST ( )  Result Date: 12/13/2020 CLINICAL DATA:  Neck surgery several years ago, neck injury 6 weeks ago, headache and dizziness EXAM: CT HEAD WITHOUT CONTRAST TECHNIQUE: Contiguous axial images were obtained from the base of the skull through the vertex without intravenous contrast. COMPARISON:  None. FINDINGS: Brain: No acute infarct or hemorrhage.  Lateral ventricles and midline structures are unremarkable. No acute extra-axial fluid collections. No mass effect. Vascular: No hyperdense vessel or unexpected calcification. Skull: Normal. Negative for fracture or focal lesion. Sinuses/Orbits: No acute finding. Other: None. IMPRESSION: 1. No acute intracranial process. Electronically Signed   By: Sharlet Salina M.D.   On: 12/13/2020 15:27   CT Cervical Spine Wo Contrast  Result Date: 12/13/2020 CLINICAL DATA:  Previous spinal fusion, neck injury 6 weeks ago, headache and dizziness EXAM: CT CERVICAL SPINE WITHOUT CONTRAST TECHNIQUE: Multidetector CT imaging of the cervical spine was performed without intravenous contrast. Multiplanar CT image reconstructions were also generated. COMPARISON:  08/12/2019, 07/23/2019 FINDINGS: Alignment: Alignment is anatomic. Skull base and vertebrae: No acute fracture. No primary bone lesion or focal pathologic process. Soft tissues and spinal canal: No prevertebral fluid or swelling. No visible canal hematoma. Disc levels: Prior C6-7 ACDF with no evidence of metallic hardware failure or loosening. There is mild uncovertebral hypertrophy at C4-5 and C5-6, without significant neural foraminal encroachment. At C5-6 there is broad-based disc bulge, resulting in at least mild central canal stenosis. Upper chest: Central airway is patent.  Lung apices are clear. Other: Reconstructed images demonstrate no additional findings. IMPRESSION: 1. No acute cervical spine fracture. 2. Broad-based disc bulge and bilateral uncovertebral hypertrophy at C5-6, resulting in at least mild central canal stenosis. 3. Stable ACDF at C6-7. Electronically Signed   By: Sharlet Salina M.D.   On: 12/13/2020 15:25    ____________________________________________   PROCEDURES  Procedure(s) performed (including Critical Care):  Procedures  Norco 5-325 mg PO Zofran 4 mg ODT ____________________________________________   INITIAL IMPRESSION /  ASSESSMENT AND PLAN / ED COURSE  As part of my medical decision making, I reviewed the following data within the electronic MEDICAL RECORD NUMBER Labs reviewed WNL, EKG interpreted NSR, Radiograph reviewed as noted, and Notes from prior ED visits     Differential diagnosis includes, but is not limited to, intracranial hemorrhage, meningitis/encephalitis, previous head trauma, cavernous venous thrombosis, tension headache, temporal arteritis, migraine or migraine equivalent, idiopathic intracranial hypertension, and non-specific headache.  Patient ED evaluation of symptoms that have been persistent for the last 6 weeks including posterior headache and dizziness.  Patient was evaluated with complaints in the ED, with labs and imaging of the head and neck.  Patient's case has been discussed with my colleague Melissa Noon, PA-C and my attending P. Roxan Hockey, MD. disposition and management will be determined after MRI machine is available.  Clinical Course as of 12/13/20 1636  Tue Dec 13, 2020  1532 CT Cervical Spine  Wo Contrast [JM]    Clinical Course User Index [JM] Devanta Daniel, Charlesetta IvoryJenise V Bacon, PA-C     ____________________________________________   FINAL CLINICAL IMPRESSION(S) / ED DIAGNOSES  Final diagnoses:  Dizziness     ED Discharge Orders     None        Note:  This document was prepared using Dragon voice recognition software and may include unintentional dictation errors.    Lissa HoardMenshew, Davier Tramell V Bacon, PA-C 12/13/20 1638    Willy Eddyobinson, Patrick, MD 12/13/20 [phone]

## 2020-12-13 NOTE — ED Notes (Signed)
Says he injured neck several weeks ago. Pain inl ower neck since and he has contacted the surgeon who did his neck surgery prior.  He also says he feels like he is going to fall back if he moves his neck a certain way and has a constant headache.posterior head and down to neck.

## 2020-12-13 NOTE — ED Triage Notes (Signed)
Pt reports that he had neck surgery a few years and reinjured his neck below where he has surgery about 6 weeks ago. He has had a headache and dizziness since.

## 2020-12-13 NOTE — Discharge Instructions (Addendum)
Please keep appointment with neurosurgery this week.  Take tapered steroid as directed.

## 2022-12-27 NOTE — Progress Notes (Addendum)
Referring Physician:  Kandyce Rud, MD [phone] [address] [facility] [place],  [place] [address]  Primary Physician:  Kyle Rud, MD  History of Present Illness: 12/31/2022 [name] has a history of Barrett's esophagus, hyperlipidemia.   History of right shoulder rotator cuff repair x 2. Saw ortho on 09/04/22 and they thing he has a failed repair- he was given right shoulder injection.   He is s/p ACDF C6-C7 on 08/12/19 by Dr. Myer Gallegos. Last seen by him on 01/06/21 with neck and left scapular pain. He had new HNP at C5-C6 above his prior ACDF at C6-C7. He had some relief with ESI. He was to get repeat ESI and do some exercises.   He improved and was lost to follow up.   3-4 month history of neck pain that radiates into his right arm to his hand (entire hand except for small finger) with numbness and tingling. He notes weakness in both arms. Pain is worse with extension and better with flexion. No known injury.   No relief with OTC aleve and motrin.   Bowel/Bladder Dysfunction: none  He does not smoke.   Conservative measures:  Physical therapy: none Multimodal medical therapy including regular antiinflammatories: motrin, aleve.   Injections:  Left C5-C6 TF ESI 01/10/21 with Dr. Yves Gallegos C7-T1 ESI on 12/19/20  Past Surgery:  ACDF C6-C7 08/12/19 by Dr. Carolee Gallegos has no symptoms of cervical myelopathy.  The symptoms are causing a significant impact on the patient's life.   Review of Systems:  A 10 point review of systems is negative, except for the pertinent positives and negatives detailed in the HPI.  Past Medical History: Past Medical History:  Diagnosis Date   Anxiety    Arthritis    Barrett's esophagus    Chronic kidney disease    STAGE iii CKD   Depression    GERD (gastroesophageal reflux disease)    Hiatal hernia    BARRETTS ESOPHAGUS   History of kidney stones    Nausea 04/24/2018   Nausea most  of the time for the last month. Vomiting on/off.    Past Surgical History: Past Surgical History:  Procedure Laterality Date   ANTERIOR CERVICAL DECOMP/DISCECTOMY FUSION N/A 08/12/2019   Procedure: ANTERIOR CERVICAL DECOMPRESSION/DISCECTOMY FUSION 1 LEVEL C6-7;  Surgeon: Kyle Night, MD;  Location: [facility] ORS;  Service: Neurosurgery;  Laterality: N/A;   COLONOSCOPY  2018   Dr Norma Fredrickson   FOOT SURGERY Left 2014   NERVE REMOVED   FOOT SURGERY Right 2016   NERVE REMOVED   UPPER GI ENDOSCOPY  2018   Dr Norma Fredrickson   VASECTOMY      Allergies: Allergies as of 12/31/2022   (No Known Allergies)    Medications: Outpatient Encounter Medications as of 12/31/2022  Medication Sig   atorvastatin (LIPITOR) 10 MG tablet Take 5 mg by mouth daily.    buPROPion (WELLBUTRIN XL) 150 MG 24 hr tablet Take 150 mg by mouth daily.    cholecalciferol (VITAMIN D3) 25 MCG (1000 UNIT) tablet Take 1,000 Units by mouth daily.   escitalopram (LEXAPRO) 10 MG tablet Take 5 mg by mouth daily.   meloxicam (MOBIC) 15 MG tablet Take 1 tablet (15 mg total) by mouth daily. Take with food.   pantoprazole (PROTONIX) 40 MG tablet Take 40 mg by mouth daily.    [DISCONTINUED] predniSONE (STERAPRED UNI-PAK 21 TAB) 10 MG (21) TBPK tablet 6,6,5,5,4,4,3,3,2,2,1,1   No facility-administered encounter medications on  file as of 12/31/2022.    Social History: Social History   Tobacco Use   Smoking status: Never   Smokeless tobacco: Never  Vaping Use   Vaping status: Never Used  Substance Use Topics   Alcohol use: Yes    Comment: occ    Drug use: No    Family Medical History: Family History  Problem Relation Age of Onset   Breast cancer Mother    Bladder Cancer Mother    Heart attack Mother    Stroke Father     Physical Examination: Vitals:   12/31/22 0907  BP: 130/76    General: Patient is well developed, well nourished, calm, collected, and in no apparent distress. Attention to examination is  appropriate.  Respiratory: Patient is breathing without any difficulty.   NEUROLOGICAL:     Awake, alert, oriented to person, place, and time.  Speech is clear and fluent. Fund of knowledge is appropriate.   Cranial Nerves: Pupils equal round and reactive to light.  Facial tone is symmetric.    Well healed cervical incision.  No posterior cervical tenderness. No tenderness in bilateral trapezial region.   No abnormal lesions on exposed skin.   Strength: Side Biceps Triceps Deltoid Interossei Grip Wrist Ext. Wrist Flex.  R 5 5 5 5 5 5 5   L 5 5 5 5 5 5 5    Side Iliopsoas Quads Hamstring PF DF EHL  R 5 5 5 5 5 5   L 5 5 5 5 5 5    Reflexes are 2+ and symmetric at the biceps, brachioradialis, patella and achilles.   Hoffman's is absent.  Clonus is not present.   Bilateral upper and lower extremity sensation is intact to light touch.  Some diminished sensation right hand radial aspect compared to left.   Gait is normal.    Medical Decision Making  Imaging: No recent imaging.   Assessment and Plan: [name] is a pleasant 57 y.o. male has 3-4 month history of neck pain that radiates into his right arm to his hand (entire hand except for small finger) with numbness and tingling. He notes weakness in both arms.  He is s/p ACDF C6-C7 on 08/12/19 by Dr. Myer Gallegos. Previous MRI 12/13/20 showed new HNP at C5-C6 above his prior ACDF at C6-C7.  Treatment options discussed with patient and following plan made:   - MRI of cervical spine to evaluate worsening cervical radiculopathy.   - Prescription for mobic. Reviewed dosing and side effects. Take with food. Discussed slight risk if bleeding with lexapro and mobic- stop if any GI upset, N/V, or blood in stool. No issues with OTC motrin or aleve.  - Follow up with PCP regarding dizziness and right ear pain, I don't think this is cervical mediated.  - Depending on results of MRI, will likely consider repeat injections and/or PT.  - Will  schedule phone visit to review MRI results once I get them back.   I spent a total of 30 minutes in face-to-face and non-face-to-face activities related to this patient's care today including review of outside records, review of imaging, review of symptoms, physical exam, discussion of differential diagnosis, discussion of treatment options, and documentation.   Thank you for involving me in the care of this patient.   Drake Leach PA-C Dept. of Neurosurgery

## 2022-12-31 ENCOUNTER — Ambulatory Visit (INDEPENDENT_AMBULATORY_CARE_PROVIDER_SITE_OTHER): Payer: No Typology Code available for payment source | Admitting: Orthopedic Surgery

## 2022-12-31 ENCOUNTER — Encounter: Payer: Self-pay | Admitting: Orthopedic Surgery

## 2022-12-31 VITALS — BP 130/76 | Ht 73.0 in | Wt 214.6 lb

## 2022-12-31 DIAGNOSIS — M4722 Other spondylosis with radiculopathy, cervical region: Secondary | ICD-10-CM | POA: Diagnosis not present

## 2022-12-31 DIAGNOSIS — M47812 Spondylosis without myelopathy or radiculopathy, cervical region: Secondary | ICD-10-CM

## 2022-12-31 DIAGNOSIS — M50022 Cervical disc disorder at C5-C6 level with myelopathy: Secondary | ICD-10-CM

## 2022-12-31 DIAGNOSIS — Z981 Arthrodesis status: Secondary | ICD-10-CM

## 2022-12-31 DIAGNOSIS — M5020 Other cervical disc displacement, unspecified cervical region: Secondary | ICD-10-CM

## 2022-12-31 DIAGNOSIS — M5412 Radiculopathy, cervical region: Secondary | ICD-10-CM

## 2022-12-31 MED ORDER — MELOXICAM 15 MG PO TABS
15.0000 mg | ORAL_TABLET | Freq: Every day | ORAL | 1 refills | Status: DC
Start: 2022-12-31 — End: 2023-11-04

## 2022-12-31 NOTE — Patient Instructions (Signed)
It was so nice to see you today. Thank you so much for coming in.    I want to get an MRI of your neck to look into things further. We will get this approved through your insurance and [facility] will call you to schedule the appointment.   [facility] (building with the white pillars) is located off of [address]. The address is [address], [place], [place] [address].   After you have the MRI, it takes 10-14 days for me to get the results back. Once I have them, we will call you to schedule a follow up phone visit with me to review them.   I sent a prescription for meloxicam to help with pain and inflammation. Take as directed with food.   Watch for any signs/symptoms of GI upset such as nausea, vomiting, or blood in your stool. There is a small increase in bleeding when taking lexapro and anti-inflammatory medications such as meloxicam. The risk is the same of motrin or aleve.   Follow up with your PCP for dizziness and ear pain. I don't think this is from your neck.   Please do not hesitate to call if you have any questions or concerns. You can also message me in MyChart.   Drake Leach PA-C [phone]

## 2023-01-07 ENCOUNTER — Ambulatory Visit
Admission: RE | Admit: 2023-01-07 | Discharge: 2023-01-07 | Disposition: A | Discharge status: Home / Self Care | Payer: No Typology Code available for payment source | Source: Ambulatory Visit | Attending: Orthopedic Surgery | Admitting: Orthopedic Surgery

## 2023-01-07 DIAGNOSIS — M47812 Spondylosis without myelopathy or radiculopathy, cervical region: Secondary | ICD-10-CM | POA: Insufficient documentation

## 2023-01-07 DIAGNOSIS — M5412 Radiculopathy, cervical region: Secondary | ICD-10-CM | POA: Diagnosis present

## 2023-01-07 DIAGNOSIS — M5020 Other cervical disc displacement, unspecified cervical region: Secondary | ICD-10-CM | POA: Diagnosis present

## 2023-01-07 DIAGNOSIS — Z981 Arthrodesis status: Secondary | ICD-10-CM | POA: Diagnosis present

## 2023-01-25 NOTE — Progress Notes (Unsigned)
Telephone Visit- Progress Note: Referring Physician:  No referring provider defined for this encounter.  Primary Physician:  Kandyce Rud, MD  This visit was performed via telephone.  Patient location: home Provider location: office  I spent a total of 10 minutes non-face-to-face activities for this visit on the date of this encounter including review of current clinical condition and response to treatment.    Patient has given verbal consent to this telephone visits and we reviewed the limitations of a telephone visit. Patient wishes to proceed.    Chief Complaint:  review cervical MRI  History of Present Illness: Kyle Gallegos is a 57 y.o. male has a history of Barrett's esophagus, hyperlipidemia.    History of right shoulder rotator cuff repair x 2. Saw ortho on 09/04/22 and they think he has a failed repair- he was given right shoulder injection.    He is s/p ACDF C6-C7 on 08/12/19 by Dr. Myer Haff.  Last seen by me on 12/31/22 with 3-4 month history of neck pain that radiates into his right arm to his hand (entire hand except for small finger) with numbness and tingling. Previous MRI 12/13/20 showed new HNP at C5-C6 above his prior ACDF at C6-C7.   He was started on mobic at his last visit. He was to f/u with PCP regarding dizziness and right ear pain.   Phone visit scheduled to review his cervical MRI.   His pain is about the same. He continues with neck pain that radiates into his right arm to his hand (entire hand except for small finger) with numbness and tingling.  He notes weakness in both arms. He is on mobic, not sure it is helping.   He does not smoke.    Conservative measures:  Physical therapy: none Multimodal medical therapy including regular antiinflammatories: motrin, aleve.   Injections:  Left C5-C6 TF ESI 01/10/21 with Dr. Yves Dill C7-T1 ESI on 12/19/20   Past Surgery:  ACDF C6-C7 08/12/19 by Dr. Carolee Rota has no symptoms of  cervical myelopathy.   The symptoms are causing a significant impact on the patient's life.   Exam: No exam done as this was a telephone encounter.     Imaging: Cervical MRI dated 01/07/23:  FINDINGS: Alignment: Physiologic.   Vertebrae: No acute fracture, evidence of discitis, or aggressive bone lesion.   Cord: Normal signal and morphology.   Posterior Fossa, vertebral arteries, paraspinal tissues: Posterior fossa demonstrates no focal abnormality. Vertebral artery flow voids are maintained. Paraspinal soft tissues are unremarkable.   Disc levels:   Discs: Anterior cervical fusion at C6-7. Minimal disc height loss at C5-6.   C2-3: No significant disc bulge. No neural foraminal stenosis. No central canal stenosis.   C3-4: Mild broad-based disc bulge. Mild-moderate right foraminal stenosis. No left foraminal stenosis. No spinal stenosis.   C4-5: Broad-based disc bulge with a tiny central disc protrusion. Moderate left and mild right foraminal stenosis. No spinal stenosis.   C5-6: Broad-based disc bulge. Moderate-severe left foraminal stenosis. Moderate right foraminal stenosis. Mild spinal stenosis.   C6-7: Interbody fusion.  No foraminal or central canal stenosis.   C7-T1: No significant disc bulge. No neural foraminal stenosis. No central canal stenosis.   IMPRESSION: 1. At C5-6 there is a broad-based disc bulge. Moderate-severe left foraminal stenosis. Moderate right foraminal stenosis. Mild spinal stenosis. 2. At C4-5 there is a broad-based disc bulge with a tiny central disc protrusion. Moderate left and mild right foraminal stenosis. 3. At C3-4  there is a mild broad-based disc bulge. Mild-moderate right foraminal stenosis. 4. At C6-7 there is interbody fusion without foraminal or central canal stenosis. 5.  No acute osseous injury of the cervical spine.     Electronically Signed   By: Elige Ko M.D.   On: 01/25/2023 09:02  I have personally reviewed  the images and agree with the above interpretation.  Assessment and Plan: Kyle Gallegos is a pleasant 57 y.o. male with  persistent neck pain that radiates into his right arm to his hand (entire hand except for small finger) with numbness and tingling. He notes weakness in both arms.   He is s/p ACDF C6-C7 on 08/12/19 by Dr. Myer Haff. Above MRI shows broad based disc at C5-C6 with moderate right/severe left foraminal stenosis and mild central stenosis. This is likely his pain generator.    Treatment options discussed with patient and following plan made:   - Referral to PMR for cervical injections ([facility]as[facility])- will do fast tract for right C5-C6 TF ESI.  - PT discussed and recommended- he declines.  - Continue mobic for now. Reviewed dosing and side effects. Take with food.  - Discussed trial of neurontin. He declines.  - Follow up with me in 6 weeks for recheck.   Drake Leach PA-C Neurosurgery

## 2023-01-28 ENCOUNTER — Ambulatory Visit (INDEPENDENT_AMBULATORY_CARE_PROVIDER_SITE_OTHER): Payer: No Typology Code available for payment source | Admitting: Orthopedic Surgery

## 2023-01-28 ENCOUNTER — Encounter: Payer: Self-pay | Admitting: Orthopedic Surgery

## 2023-01-28 DIAGNOSIS — M5020 Other cervical disc displacement, unspecified cervical region: Secondary | ICD-10-CM

## 2023-01-28 DIAGNOSIS — Z981 Arthrodesis status: Secondary | ICD-10-CM | POA: Diagnosis not present

## 2023-01-28 DIAGNOSIS — M4722 Other spondylosis with radiculopathy, cervical region: Secondary | ICD-10-CM | POA: Diagnosis not present

## 2023-01-28 DIAGNOSIS — M5412 Radiculopathy, cervical region: Secondary | ICD-10-CM

## 2023-01-28 DIAGNOSIS — M47812 Spondylosis without myelopathy or radiculopathy, cervical region: Secondary | ICD-10-CM

## 2023-01-28 DIAGNOSIS — M50222 Other cervical disc displacement at C5-C6 level: Secondary | ICD-10-CM | POA: Diagnosis not present

## 2023-03-11 ENCOUNTER — Ambulatory Visit: Payer: No Typology Code available for payment source | Admitting: Orthopedic Surgery

## 2023-04-02 ENCOUNTER — Ambulatory Visit
Admission: EM | Admit: 2023-04-02 | Discharge: 2023-04-02 | Disposition: A | Discharge status: Home / Self Care | Payer: PRIVATE HEALTH INSURANCE | Attending: Emergency Medicine | Admitting: Emergency Medicine

## 2023-04-02 DIAGNOSIS — L729 Follicular cyst of the skin and subcutaneous tissue, unspecified: Secondary | ICD-10-CM

## 2023-04-02 DIAGNOSIS — L03011 Cellulitis of right finger: Secondary | ICD-10-CM

## 2023-04-02 MED ORDER — DOXYCYCLINE HYCLATE 100 MG PO CAPS: 100.0000 mg | ORAL_CAPSULE | Freq: Two times a day (BID) | ORAL | 0 refills | Status: AC

## 2023-04-02 NOTE — ED Provider Notes (Signed)
Kyle Gallegos    CSN: [id number] Arrival date & time: 04/02/23  2130      History   Chief Complaint Chief Complaint  Patient presents with   Hand Pain    HPI Kyle Gallegos is a 57 y.o. male.  Patient presents with pain, swelling, redness of his right middle finger x 2 days.  He states he had had a small "cyst" by his fingernail for a long time which he periodically attempts to drain.  No fever, numbness, weakness, or other symptoms.  His medical history includes GERD, Barrett's esophagus, hypercholesterolemia, CKD, anxiety, depression.  The history is provided by the patient and medical records.    Past Medical History:  Diagnosis Date   Anxiety    Arthritis    Barrett's esophagus    Chronic kidney disease    STAGE iii CKD   Depression    GERD (gastroesophageal reflux disease)    Hiatal hernia    BARRETTS ESOPHAGUS   History of kidney stones    Nausea 04/24/2018   Nausea most of the time for the last month. Vomiting on/off.    Patient Active Problem List   Diagnosis Date Noted   Barrett's esophagus without dysplasia 04/10/2018   Epigastric pain 04/10/2018   Functional dyspepsia 04/10/2018   Gastroesophageal reflux disease without esophagitis 04/05/2018   Anxiety, mild 05/28/2016   Pure hypercholesterolemia 05/28/2016   Vitamin D deficiency 09/22/2013    Past Surgical History:  Procedure Laterality Date   ANTERIOR CERVICAL DECOMP/DISCECTOMY FUSION N/A 08/12/2019   Procedure: ANTERIOR CERVICAL DECOMPRESSION/DISCECTOMY FUSION 1 LEVEL C6-7;  Surgeon: Venetia Night, MD;  Location: [facility] ORS;  Service: Neurosurgery;  Laterality: N/A;   COLONOSCOPY  2018   Dr Norma Fredrickson   FOOT SURGERY Left 2014   NERVE REMOVED   FOOT SURGERY Right 2016   NERVE REMOVED   UPPER GI ENDOSCOPY  2018   Dr Norma Fredrickson   VASECTOMY         Home Medications    Prior to Admission medications   Medication Sig Start Date End Date Taking? Authorizing Provider  doxycycline  (VIBRAMYCIN) 100 MG capsule Take 1 capsule (100 mg total) by mouth 2 (two) times daily for 7 days. 04/02/23 04/09/23 Yes Mickie Bail, NP  atorvastatin (LIPITOR) 10 MG tablet Take 5 mg by mouth daily.  05/30/17   [provider]  buPROPion (WELLBUTRIN XL) 150 MG 24 hr tablet Take 150 mg by mouth daily.  02/21/15   [provider]  cholecalciferol (VITAMIN D3) 25 MCG (1000 UNIT) tablet Take 1,000 Units by mouth daily.    [provider]  escitalopram (LEXAPRO) 10 MG tablet Take 5 mg by mouth daily. 02/03/15   [provider]  meloxicam (MOBIC) 15 MG tablet Take 1 tablet (15 mg total) by mouth daily. Take with food. 12/31/22 12/31/23  Drake Leach, PA-C  pantoprazole (PROTONIX) 40 MG tablet Take 40 mg by mouth daily.  04/10/17   [provider]    Family History Family History  Problem Relation Age of Onset   Breast cancer Mother    Bladder Cancer Mother    Heart attack Mother    Stroke Father     Social History Social History   Tobacco Use   Smoking status: Never   Smokeless tobacco: Never  Vaping Use   Vaping status: Never Used  Substance Use Topics   Alcohol use: Yes    Comment: occ    Drug use: No  Allergies   Patient has no known allergies.   Review of Systems Review of Systems  Constitutional:  Negative for chills and fever.  Musculoskeletal:  Positive for arthralgias and joint swelling.  Skin:  Positive for color change and wound.  Neurological:  Negative for weakness and numbness.     Physical Exam Triage Vital Signs ED Triage Vitals  Encounter Vitals Group     BP 04/02/23 1107 (!) 154/89     Systolic BP Percentile --      Diastolic BP Percentile --      Pulse Rate 04/02/23 1103 (!) 59     Resp 04/02/23 1103 18     Temp 04/02/23 1103 98.2 F (36.8 C)     Temp src --      SpO2 04/02/23 1103 97 %     Weight --      Height --      Head Circumference --      Peak Flow --      Pain Score 04/02/23 1102 10      Pain Loc --      Pain Education --      Exclude from Growth Chart --    No data found.  Updated Vital Signs BP (!) 154/89   Pulse (!) 59   Temp 98.2 F (36.8 C)   Resp 18   SpO2 97%   Visual Acuity Right Eye Distance:   Left Eye Distance:   Bilateral Distance:    Right Eye Near:   Left Eye Near:    Bilateral Near:     Physical Exam Constitutional:      General: He is not in acute distress. HENT:     Mouth/Throat:     Mouth: Mucous membranes are moist.  Cardiovascular:     Rate and Rhythm: Normal rate and regular rhythm.  Pulmonary:     Effort: Pulmonary effort is normal. No respiratory distress.  Musculoskeletal:        General: Swelling and tenderness present. No deformity. Normal range of motion.  Skin:    General: Skin is warm and dry.     Capillary Refill: Capillary refill takes less than 2 seconds.     Findings: Erythema and lesion present.     Comments: Swelling of right middle finger with tender erythema and pea-sized cyst at base of fingernail.  No drainage.  Neurological:     Mental Status: He is alert.     Sensory: No sensory deficit.     Motor: No weakness.      UC Treatments / Results  Labs (all labs ordered are listed, but only abnormal results are displayed) Labs Reviewed - No data to display  EKG   Radiology No results found.  Procedures Incision and Drainage  Date/Time: 04/02/2023 11:40 AM  Performed by: Mickie Bail, NP Authorized by: Mickie Bail, NP   Consent:    Consent obtained:  Verbal   Consent given by:  Patient   Risks discussed:  Bleeding, incomplete drainage, pain and infection Universal protocol:    Procedure explained and questions answered to patient or proxy's satisfaction: yes   Location:    Indications for incision and drainage: Paronychia.   Location:  Upper extremity   Upper extremity location:  Finger   Finger location:  R long finger Pre-procedure details:    Skin preparation:   Povidone-iodine Anesthesia:    Anesthesia method:  Local infiltration   Local anesthetic:  Lidocaine 1% w/o epi Procedure type:  Complexity:  Simple Procedure details:    Incision types:  Single straight   Drainage:  Bloody   Drainage amount:  Scant   Wound treatment:  Wound left open   Packing materials:  None Post-procedure details:    Procedure completion:  Tolerated well, no immediate complications  (including critical care time)  Medications Ordered in UC Medications - No data to display  Initial Impression / Assessment and Plan / UC Course  I have reviewed the triage vital signs and the nursing notes.  Pertinent labs & imaging results that were available during my care of the patient were reviewed by me and considered in my medical decision making (see chart for details).    Paronychia right middle finger, small skin cyst.  I&D performed with return of bloody drainage only.  No indication of felon finger at this time.  Treating with doxycycline.  Wound care instructions and signs of infection discussed.  Education provided on paronychia.  ED precautions given.  Instructed him to follow-up with his PCP.  Patient agrees to plan of care.  Final Clinical Impressions(s) / UC Diagnoses   Final diagnoses:  Paronychia of right middle finger  Skin cyst     Discharge Instructions      Take the doxycycline as directed.  Follow-up with your primary care provider.      ED Prescriptions     Medication Sig Dispense Auth. Provider   doxycycline (VIBRAMYCIN) 100 MG capsule Take 1 capsule (100 mg total) by mouth 2 (two) times daily for 7 days. 14 capsule Mickie Bail, NP      PDMP not reviewed this encounter.   Mickie Bail, NP 04/02/23 [phone]

## 2023-04-02 NOTE — Discharge Instructions (Addendum)
Take the doxycycline as directed.  Follow up with your primary care provider.

## 2023-04-02 NOTE — ED Triage Notes (Signed)
Patient to Urgent Care with complaints of right sided, middle finger. Reports he had a small cyst by his finger nail. He hit his finger accidentally and then woke up on Sunday with swelling and pain. Swelling extends to knuckle.  Reports he had been draining the cyst next to his finger nail once a week. Denies any other symptoms.

## 2023-10-18 ENCOUNTER — Ambulatory Visit (INDEPENDENT_AMBULATORY_CARE_PROVIDER_SITE_OTHER): Payer: Self-pay | Admitting: Podiatry

## 2023-10-18 ENCOUNTER — Encounter: Payer: Self-pay | Admitting: Podiatry

## 2023-10-18 ENCOUNTER — Ambulatory Visit (INDEPENDENT_AMBULATORY_CARE_PROVIDER_SITE_OTHER)

## 2023-10-18 DIAGNOSIS — M79671 Pain in right foot: Secondary | ICD-10-CM

## 2023-10-18 DIAGNOSIS — M722 Plantar fascial fibromatosis: Secondary | ICD-10-CM

## 2023-10-18 MED ORDER — DICLOFENAC SODIUM 75 MG PO TBEC: 75.0000 mg | DELAYED_RELEASE_TABLET | Freq: Two times a day (BID) | ORAL | 2 refills | Status: AC

## 2023-10-18 NOTE — Patient Instructions (Signed)

## 2023-10-19 NOTE — Progress Notes (Signed)
 Subjective:   Patient ID: Kyle Gallegos, male   DOB: 58 y.o.   MRN: [id number]   HPI Patient has developed a lot of pain in the plantar aspect of the right heel and states has been worse for the last few months and has not used medication for it.  Patient does not smoke likes to be active and has not had this problem on this foot before   Review of Systems  All other systems reviewed and are negative.       Objective:  Physical Exam Vitals and nursing note reviewed.  Constitutional:      Appearance: He is well-developed.  Pulmonary:     Effort: Pulmonary effort is normal.   Musculoskeletal:        General: Normal range of motion.   Skin:    General: Skin is warm.   Neurological:     Mental Status: He is alert.     Neurovascular status intact muscle strength found to be adequate range of motion within normal limits.  Patient is noted to have exquisite discomfort in the plantar aspect of the right heel at the insertional point tendon calcaneus with inflammation fluid around the medial band.  Good digital perfusion     Assessment:  Acute plantar fasciitis right with inflammation fluid at the medial band     Plan:  H&P reviewed and at this point I did sterile prep injected the plantar fascia at insertion 3 mg Kenalog  5 mg Xylocaine  put him on an anti-inflammatory that he states he can tolerate well and I advised on shoe gear modifications.  Reappoint 2 weeks  X-rays indicate that there is minimal spur no indication stress fracture arthritis

## 2023-11-04 ENCOUNTER — Ambulatory Visit (INDEPENDENT_AMBULATORY_CARE_PROVIDER_SITE_OTHER): Admitting: Podiatry

## 2023-11-04 ENCOUNTER — Encounter: Payer: Self-pay | Admitting: Podiatry

## 2023-11-04 DIAGNOSIS — M722 Plantar fascial fibromatosis: Secondary | ICD-10-CM | POA: Diagnosis not present

## 2023-11-05 NOTE — Progress Notes (Signed)
 Subjective:   Patient ID: Kyle Gallegos, male   DOB: 58 y.o.   MRN: [id number]   HPI Patient states he is doing very well very pleased with  how his heel has responded   ROS      Objective:  Physical Exam  Neurovascular status intact significant diminishment of discomfort plantar heel right     Assessment:  Doing well post acute plantar fasciitis right     Plan:  H&P reviewed advised on stretching exercises shoe gear modifications reappoint as symptoms indicate

## 2024-01-27 ENCOUNTER — Ambulatory Visit (INDEPENDENT_AMBULATORY_CARE_PROVIDER_SITE_OTHER): Payer: Self-pay

## 2024-01-27 DIAGNOSIS — K449 Diaphragmatic hernia without obstruction or gangrene: Secondary | ICD-10-CM

## 2024-01-27 DIAGNOSIS — K2970 Gastritis, unspecified, without bleeding: Secondary | ICD-10-CM

## 2024-01-27 DIAGNOSIS — K2270 Barrett's esophagus without dysplasia: Secondary | ICD-10-CM

## 2024-05-04 ENCOUNTER — Ambulatory Visit: Payer: Self-pay

## 2024-05-04 DIAGNOSIS — R194 Change in bowel habit: Secondary | ICD-10-CM

## 2024-05-04 DIAGNOSIS — K642 Third degree hemorrhoids: Secondary | ICD-10-CM
